# Patient Record
Sex: Male | Born: 1935
Health system: Southern US, Community
[De-identification: ages and names within clinical notes are randomized; demographics above are authoritative.]

## PROBLEM LIST (undated history)

## (undated) DIAGNOSIS — I2510 Atherosclerotic heart disease of native coronary artery without angina pectoris: Secondary | ICD-10-CM

## (undated) DIAGNOSIS — Z7901 Long term (current) use of anticoagulants: Secondary | ICD-10-CM

## (undated) DIAGNOSIS — I10 Essential (primary) hypertension: Secondary | ICD-10-CM

## (undated) DIAGNOSIS — I4891 Unspecified atrial fibrillation: Secondary | ICD-10-CM

## (undated) DIAGNOSIS — Z950 Presence of cardiac pacemaker: Secondary | ICD-10-CM

## (undated) DIAGNOSIS — D510 Vitamin B12 deficiency anemia due to intrinsic factor deficiency: Secondary | ICD-10-CM

## (undated) DIAGNOSIS — N210 Calculus in bladder: Secondary | ICD-10-CM

## (undated) DIAGNOSIS — E785 Hyperlipidemia, unspecified: Secondary | ICD-10-CM

## (undated) DIAGNOSIS — N200 Calculus of kidney: Secondary | ICD-10-CM

## (undated) HISTORY — DX: Long term (current) use of anticoagulants: Z79.01

## (undated) HISTORY — DX: Atherosclerotic heart disease of native coronary artery without angina pectoris: I25.10

## (undated) HISTORY — DX: Essential (primary) hypertension: I10

## (undated) HISTORY — DX: Vitamin B12 deficiency anemia due to intrinsic factor deficiency: D51.0

## (undated) HISTORY — DX: Presence of cardiac pacemaker: Z95.0

## (undated) HISTORY — DX: Calculus of kidney: N20.0

## (undated) HISTORY — DX: Unspecified atrial fibrillation: I48.91

## (undated) HISTORY — PX: APPENDECTOMY: SHX54

## (undated) HISTORY — DX: Hyperlipidemia, unspecified: E78.5

## (undated) HISTORY — DX: Calculus in bladder: N21.0

## (undated) HISTORY — PX: NASAL SINUS SURGERY: SHX719

## (undated) HISTORY — PX: PACEMAKER INSERTION: SHX728

## (undated) HISTORY — PX: INGUINAL HERNIA REPAIR: SHX194

---

## 2002-12-31 ENCOUNTER — Encounter (INDEPENDENT_AMBULATORY_CARE_PROVIDER_SITE_OTHER): Payer: Self-pay | Admitting: Cardiology

## 2002-12-31 ENCOUNTER — Ambulatory Visit (HOSPITAL_COMMUNITY): Admission: RE | Admit: 2002-12-31 | Discharge: 2002-12-31 | Payer: Self-pay | Admitting: Cardiology

## 2003-06-26 ENCOUNTER — Inpatient Hospital Stay (HOSPITAL_COMMUNITY): Admission: EM | Admit: 2003-06-26 | Discharge: 2003-06-29 | Payer: Self-pay | Admitting: Emergency Medicine

## 2004-11-22 ENCOUNTER — Encounter: Admission: RE | Admit: 2004-11-22 | Discharge: 2004-11-22 | Payer: Self-pay | Admitting: Cardiology

## 2006-06-08 ENCOUNTER — Encounter: Admission: RE | Admit: 2006-06-08 | Discharge: 2006-06-08 | Payer: Self-pay | Admitting: Cardiology

## 2006-07-07 IMAGING — CR DG CHEST 2V
2 series · 2 of 2 positions shown · non-contrast
Comparison: 06/28/03.

CLINICAL DATA: Evaluate pacemaker.
 TWO VIEW CHEST:

[w chest pa]
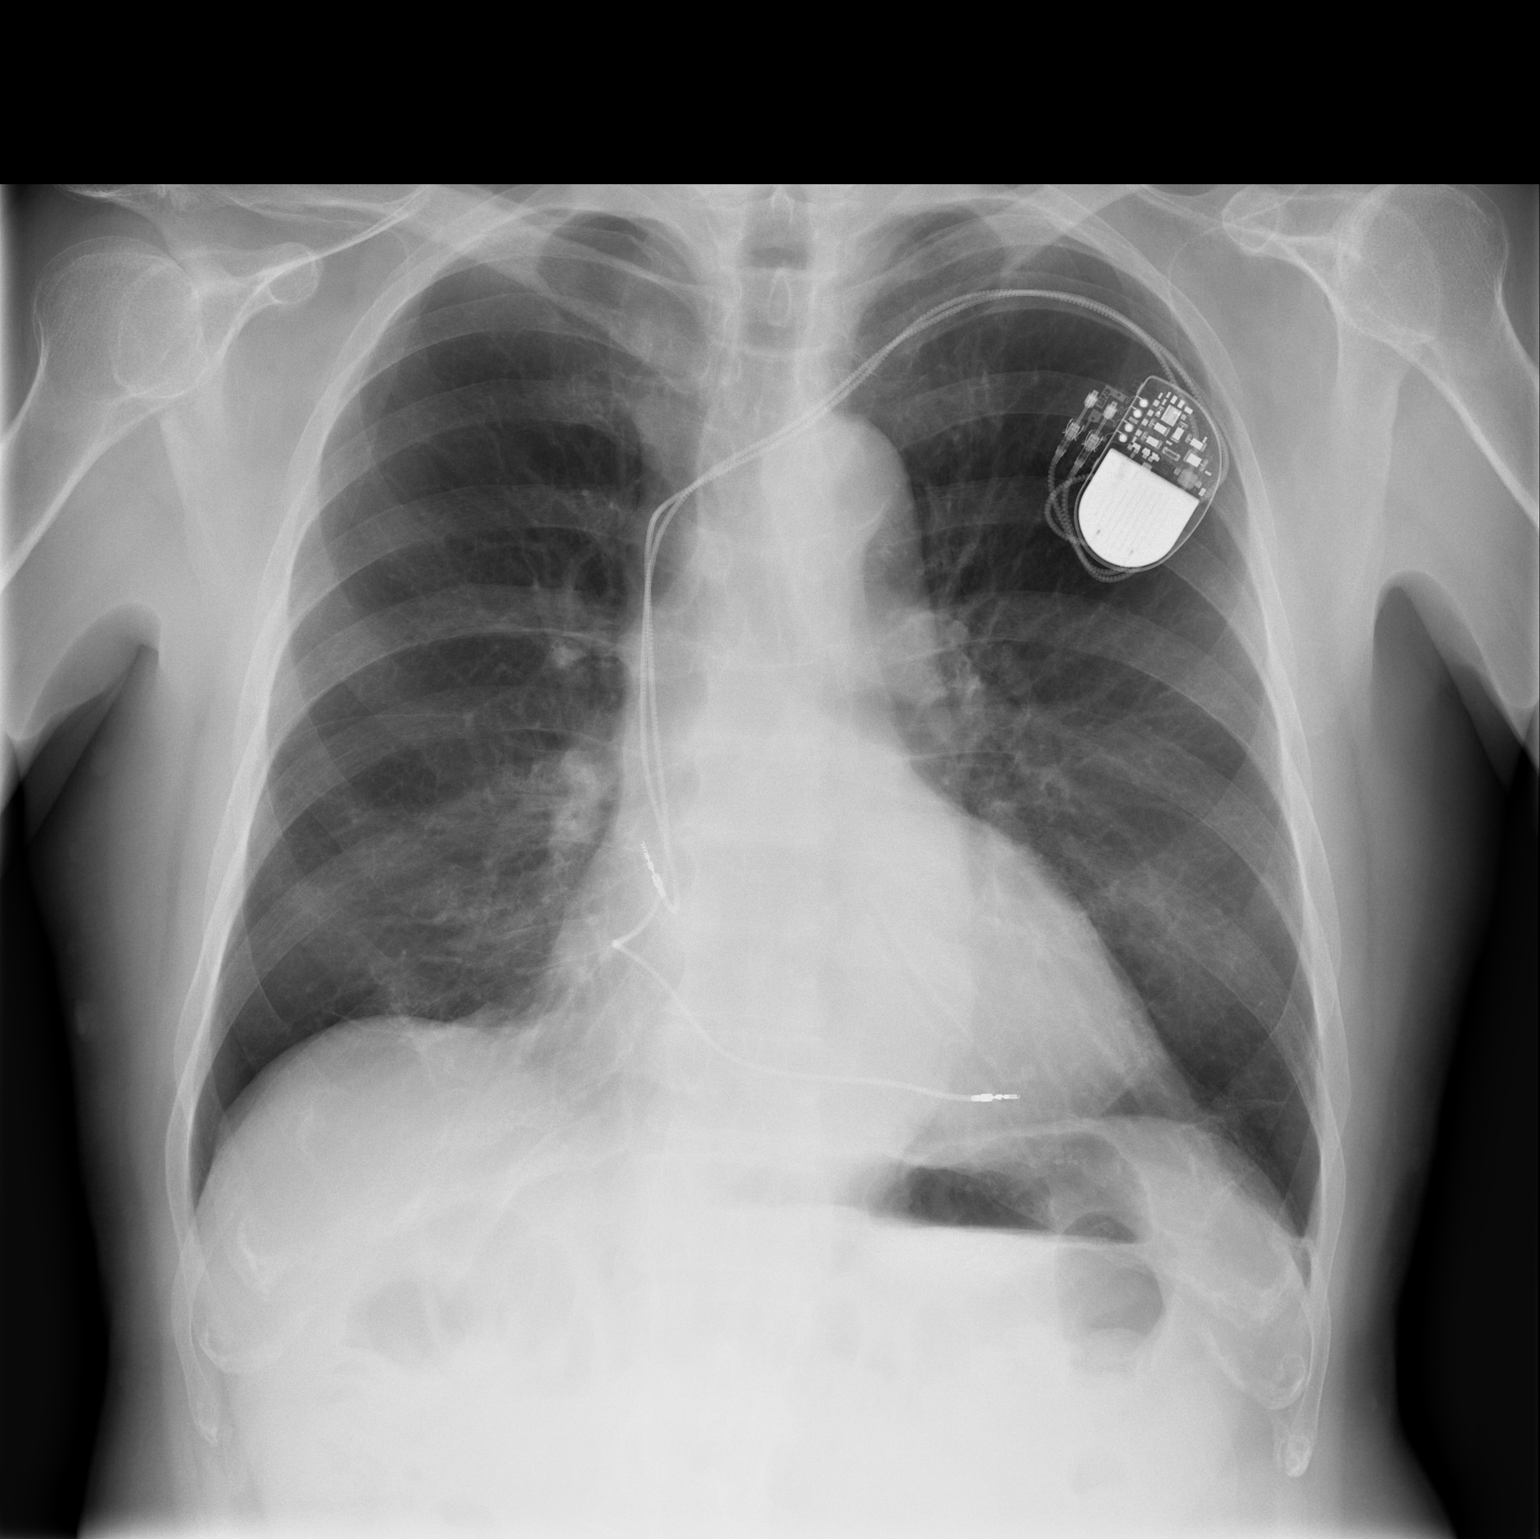

[w chest lat]
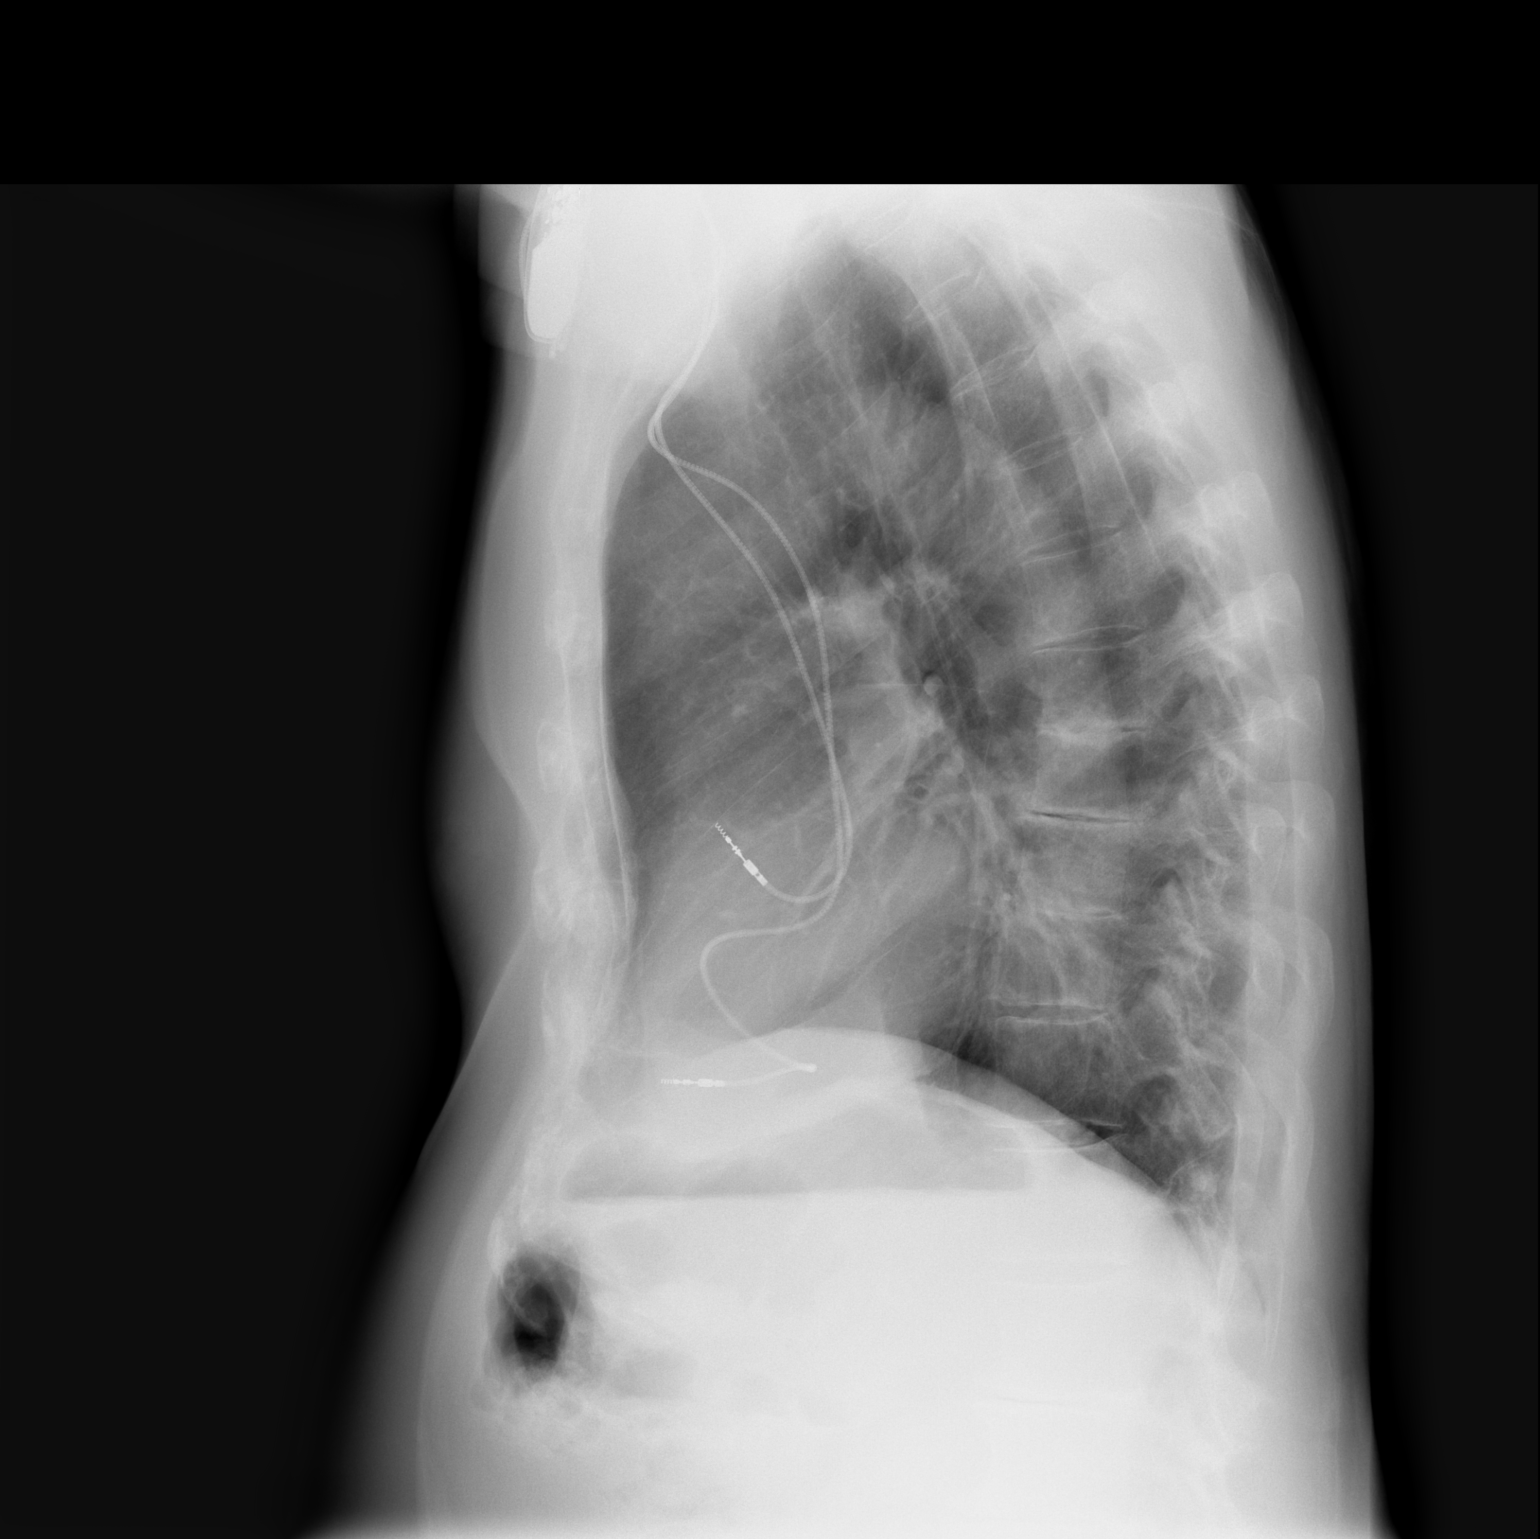

[2 of 2 positions shown; findings below may reference images not displayed]

FINDINGS: A left sided AICD device is identified with leads in the projection of the right atrial appendage and right ventricle.   Leads are intact.  No complications noted. 
 Heart size is upper limits of normal.  No focal lung opacities are identified.  There is no evidence for pneumonia.
IMPRESSION: No active cardiopulmonary abnormalities.
 No complications associated with the AICD device noted.

## 2007-06-12 ENCOUNTER — Encounter: Admission: RE | Admit: 2007-06-12 | Discharge: 2007-06-12 | Payer: Self-pay | Admitting: Cardiology

## 2008-01-21 IMAGING — CR DG CHEST 2V
2 series · 2 of 2 positions shown · non-contrast
Comparison: 11/22/04.

CLINICAL DATA: Pacemaker.  
 CHEST ? 2 VIEW:

[w chest pa]
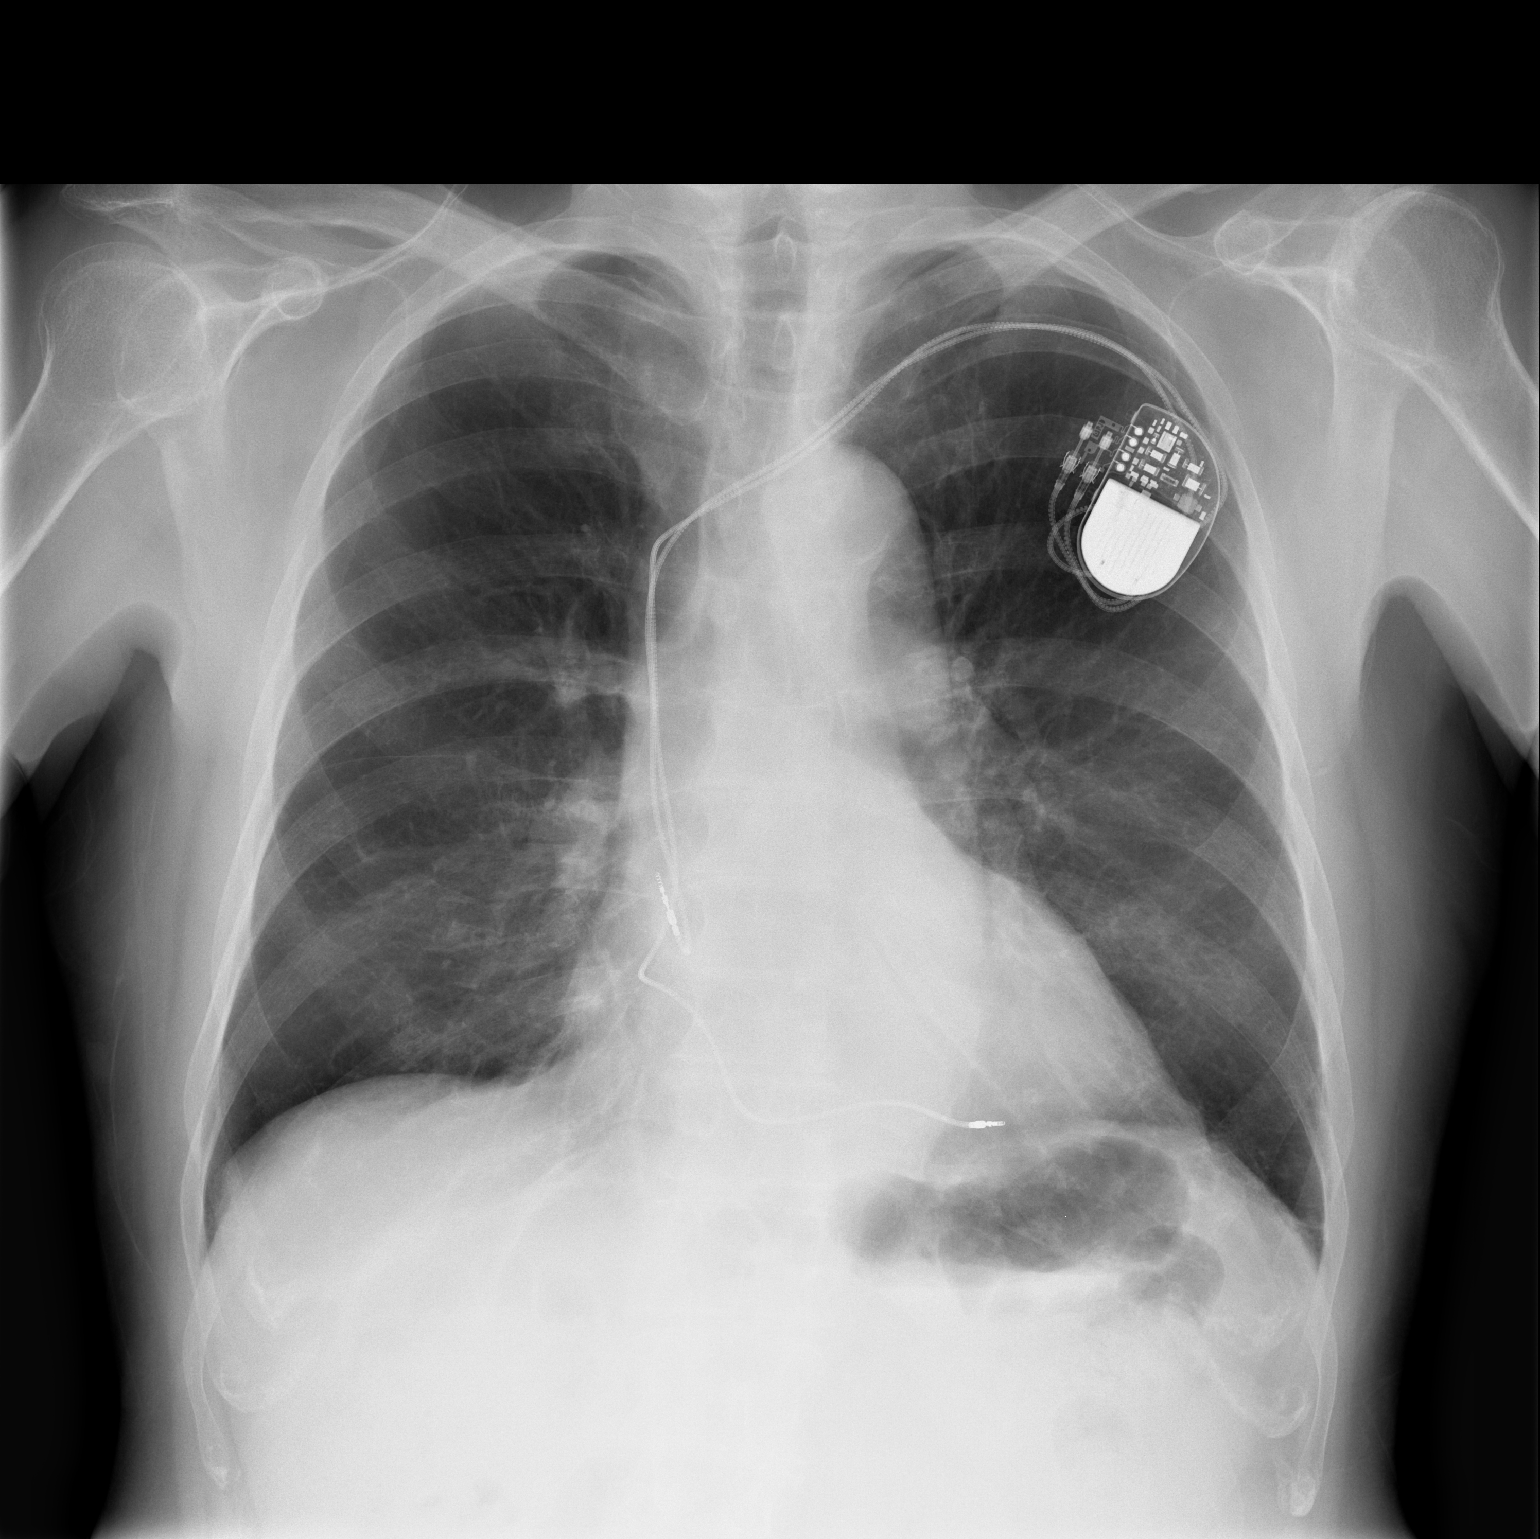

[w chest lat]
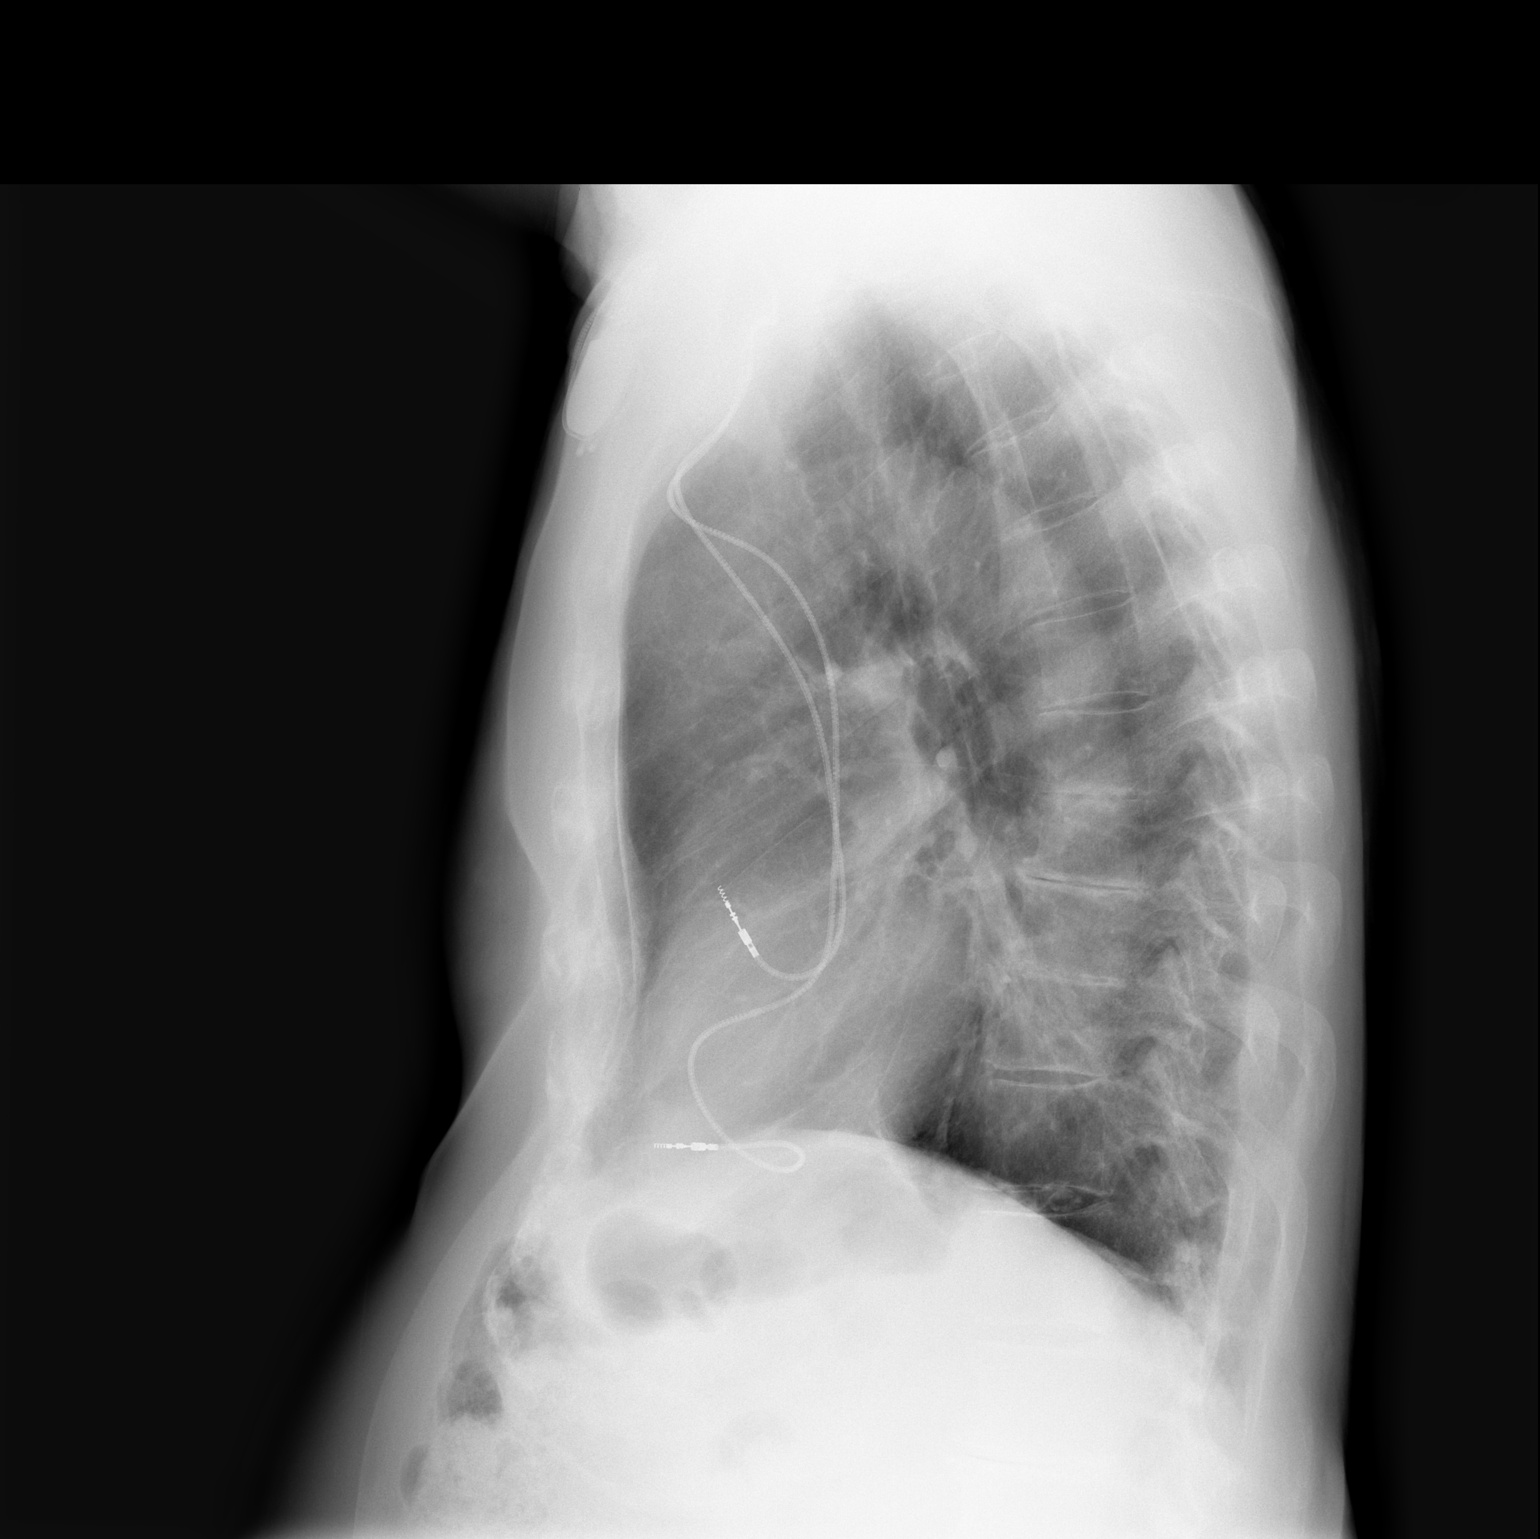

[2 of 2 positions shown; findings below may reference images not displayed]

FINDINGS: Dual lead pacemaker remains in place with the leads unchanged and in the region of the right atrium and right ventricle.  Heart size is normal.  The vascularity is normal.  No effusions.  Ordinary degenerative changes affect the spine.
IMPRESSION: No active disease.

## 2008-06-10 ENCOUNTER — Encounter: Admission: RE | Admit: 2008-06-10 | Discharge: 2008-06-10 | Payer: Self-pay | Admitting: Cardiology

## 2010-01-23 IMAGING — CR DG CHEST 2V
2 series · 2 of 2 positions shown · non-contrast
Comparison: [HOSPITAL] at [REDACTED] [HOSPITAL] chest x-ray
06/12/2007.

CLINICAL DATA: Coronary atherosclerosis.  No current chest
complaints.

CHEST - 2 VIEW

[w chest pa]
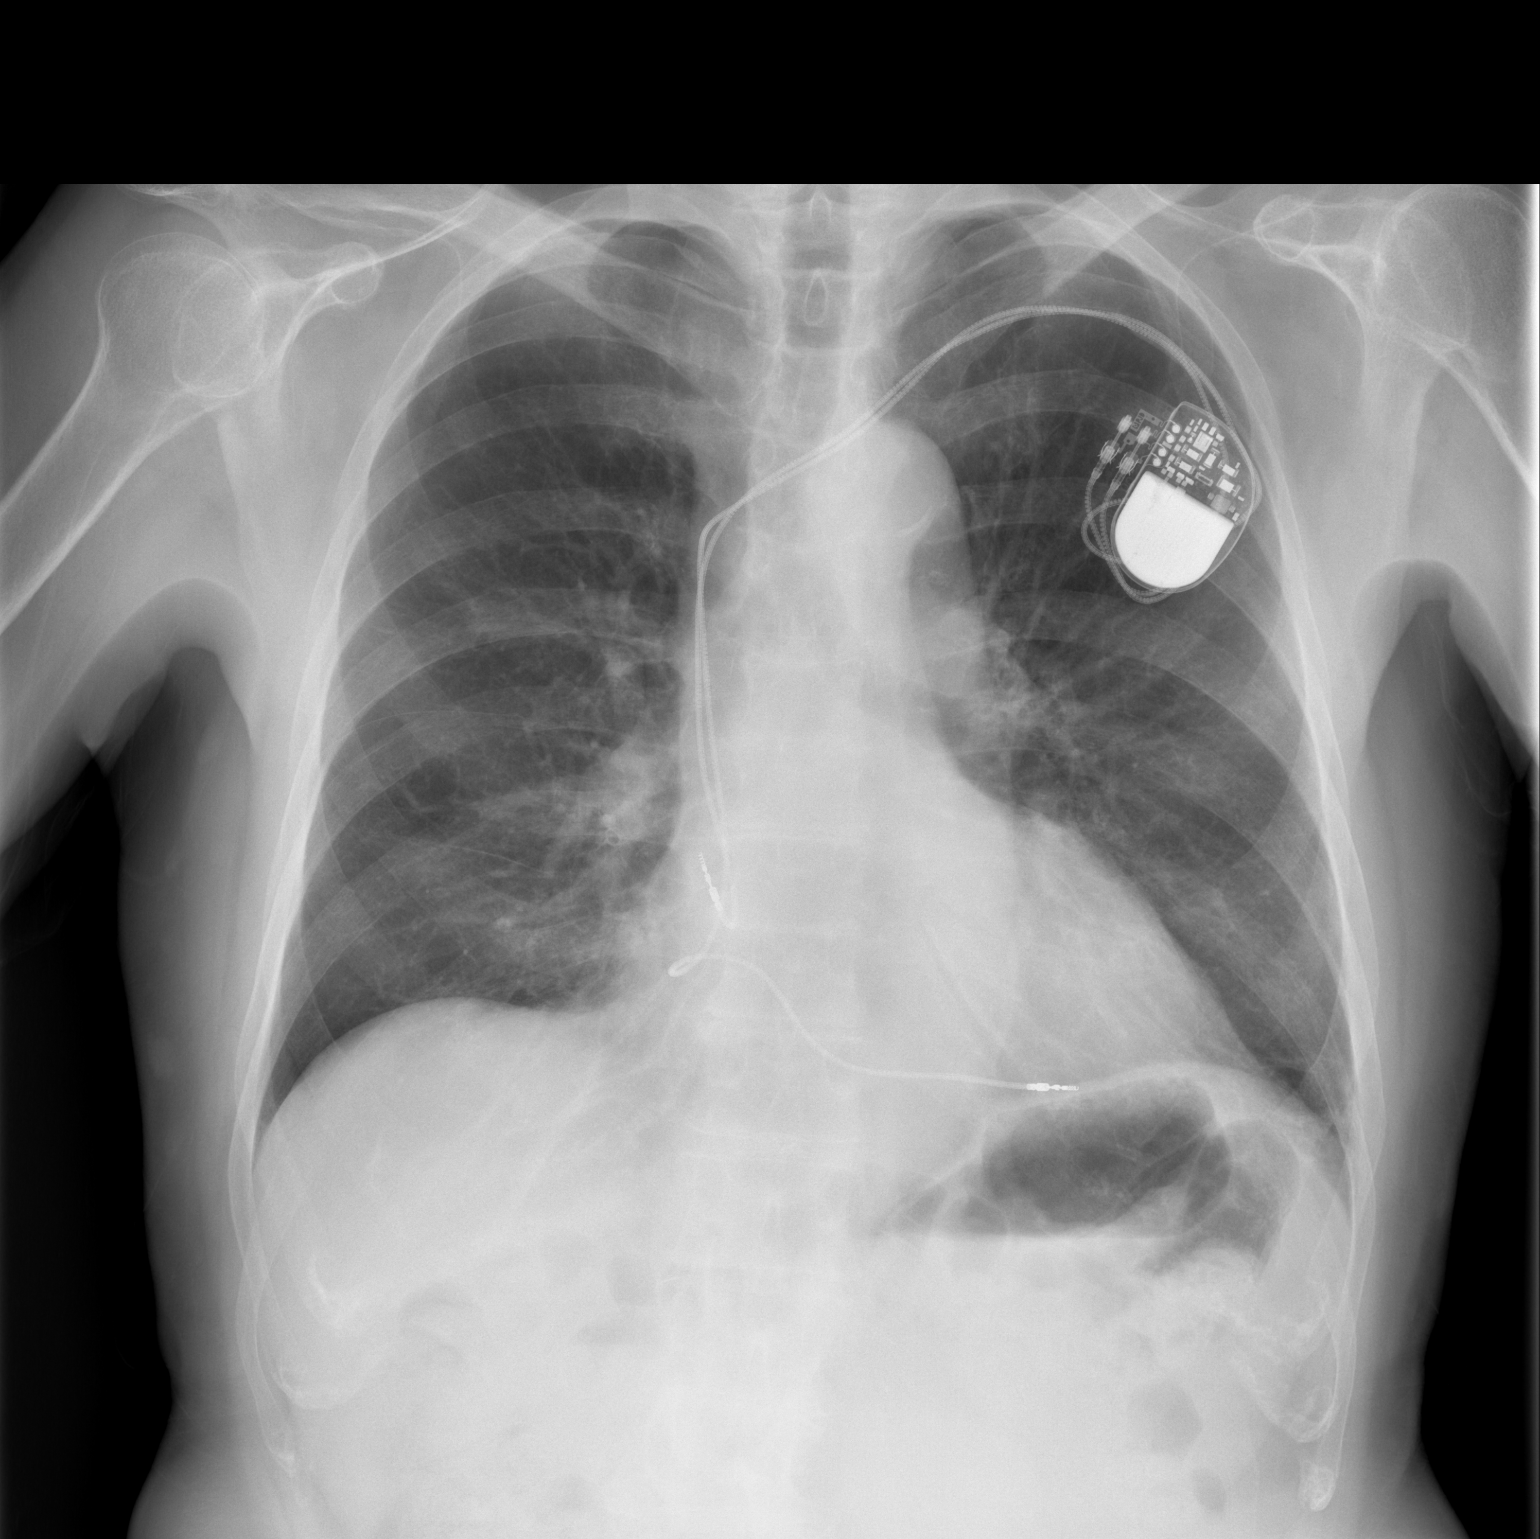

[w chest lat]
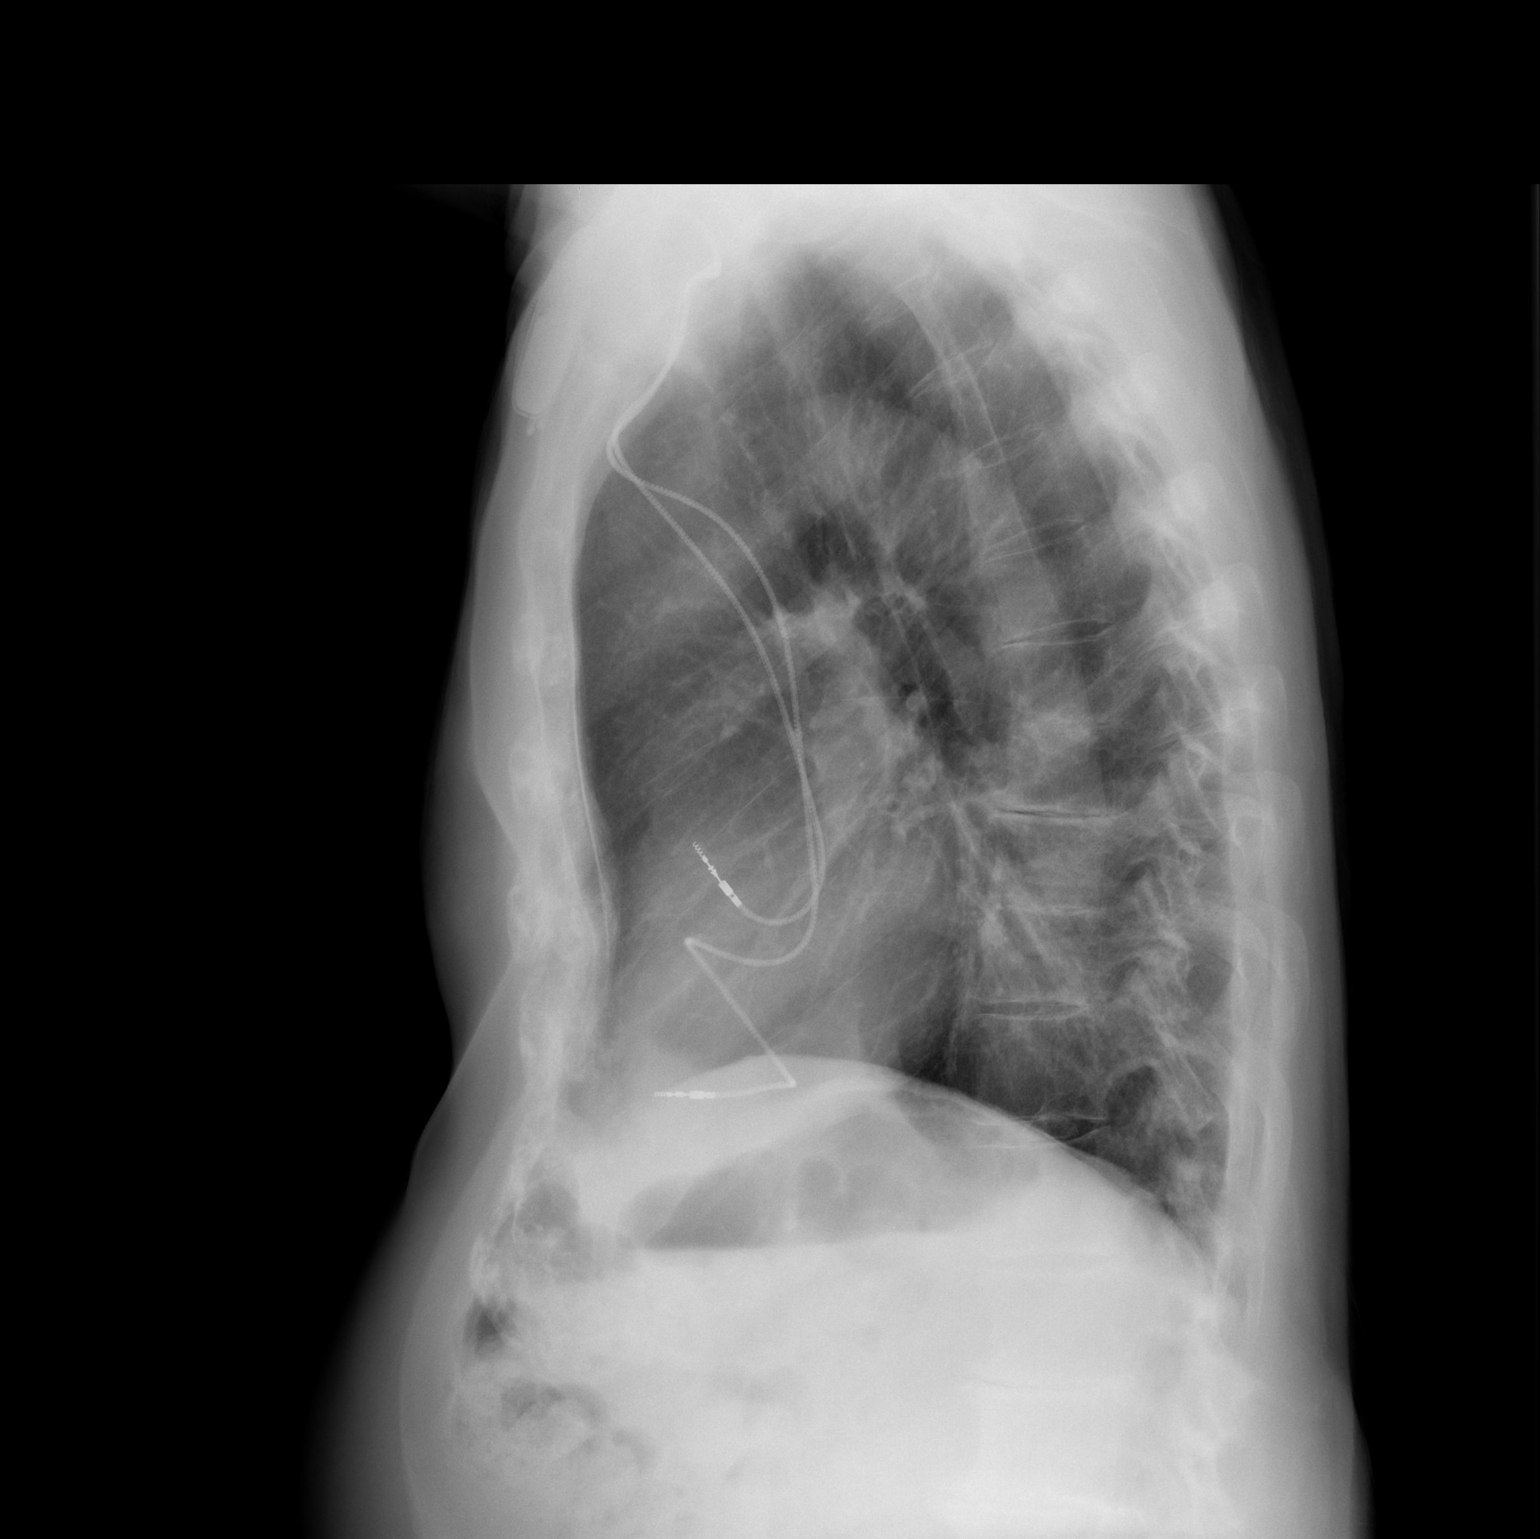

[2 of 2 positions shown; findings below may reference images not displayed]

FINDINGS: Lungs remain clear.  Heart size is upper limits of normal
with stable position of dual lead permanent cardiac pacemaker.  No
new significant abnormalities seen.
IMPRESSION: Stable.  No active disease.

REF:G3 DICTATED: 06/10/2008 [DATE]

## 2010-09-17 NOTE — Op Note (Signed)
NAMEVAL, SCHIAVO NO.:  [id number]   MEDICAL RECORD NO.:  [id number]                   PATIENT TYPE:  INP   LOCATION:  2927                                 FACILITY:  [facility]   PHYSICIAN:  Doylene Canning. Ladona Ridgel, M.D.               DATE OF BIRTH:  1916-06-14   DATE OF PROCEDURE:  06/27/2003  DATE OF DISCHARGE:                                 OPERATIVE REPORT   PROCEDURE PERFORMED:  Implantation of a dual-chamber pacemaker.   INDICATION:  Complete heart block with syncope.   A. INTRODUCTION:  The patient is an 77 year old male, who was admitted to     the hospital with a heart rate of 30, who had experienced a syncopal     episode.  His cardiac enzymes were abnormal, and he underwent     catheterization.  This demonstrated noncritical coronary disease and mild     LV dysfunction.  He is now referred for permanent pacemaker insertion.   II:  PROCEDURE:  After informed consent was obtained, the patient was taken  to the diagnostic EP lab in a fasted state.  After the usual preparation and  draping, a total of 30 mL of lidocaine was infiltrated into the left  infraclavicular region.  The 5 cm incision was carried out over the  infraclavicular region and electrocautery utilized to dissect down to the  fascia plane.  The subclavian vein was punctured x 2 in its extrathoracic  portion, and a Medtronic model 5076, 58 cm active fixation pacing lead,  serial number [id number] was advanced into the right ventricle.  Mapping was  carried out in the right ventricle into the final site in the septum of the  artery apex, the R waves (pace) were 12 mV, and the patient's threshold in  the ventricle was 0.6 volts at 0.5 msec.  The patient's impedance was 700  ohms.  Ten volt pacing did not stimulate the diaphragm and the right  ventricle.  With the ventricular lead in satisfactory position, attention  was then turned to placement of the atrial lead.  It was paced in the  right  atrial appendage where P waves measured 1.5 mV, and the atrial threshold was  somewhat elevated initially at 2 volts at 0.5 msec.  The pacing impedance,  however, was stable at 416 ohms.  Once the lead was actually fixed, the 10  volt pacing again did not stimulate the diaphragm.  At this point, the lead  was secured to the subpectoralis fascia with a figure-of-eight silk suture.  __________ sleeve was secured with silk suture.  Electrocautery was  utilizing to make a subcutaneous pocket.  Kanamycin irrigation was utilized  to irrigate the pocket.  Electrocautery was utilized to assure hemostasis.  The Medtronic Sigma dual-chamber pacemaker, serial number [id number] was  connected to the atrial and ventricular pacing leads were placed in the  subcutaneous pocket.  Additional kanamycin  was utilized to irrigate the  pocket and generator secured with a silk suture.  The incision was closed  with a layer of 2-0 Vicryl, followed by 3-0 Vicryl, followed by a layer of 4-  0 Vicryl.  Benzoin was painted on the skin.  Steri-Strips were applied and a  pressure dressing placed, and the patient returned to his room in  satisfactory condition.  __________  complications; there were no mini procedure complications, no known poor  results, and this demonstrates successful implantation of a Medtronic dual-  chamber pacemaker in a patient with symptomatic complete heart block and a  ventricular escape of 30 beats per minute.                                               Doylene Canning. Ladona Ridgel, M.D.    GWT/MEDQ  D:  06/27/2003  T:  06/28/2003  Job:  [id number]   cc:   Darden Palmer., M.D.  [address], [address]  [place]  [place] [address]  Fax: [phone]

## 2010-09-17 NOTE — Cardiovascular Report (Signed)
NAMEEMBRY, HUSS NO.:  [id number]   MEDICAL RECORD NO.:  [id number]                   PATIENT TYPE:  INP   LOCATION:  2015                                 FACILITY:  [facility]   PHYSICIAN:  W. Ashley Royalty., M.D.         DATE OF BIRTH:  October 08, 1916   DATE OF PROCEDURE:  06/27/2003  DATE OF DISCHARGE:  06/29/2003                              CARDIAC CATHETERIZATION   INDICATIONS:  Abnormal cardiac enzymes in a patient who presented with  complete heart block.   DESCRIPTION OF PROCEDURE:  The patient had previously had a temporary  pacemaker placed via the right femoral vein.  Catheterization was done  without difficulty however, the right femoral artery had inadvertently been  entered while attempting to access the femoral vein and we were unable to do  closure of the vessel.  Following the procedure, the arterial sheath was  removed, the temporary pacemaker was left in place and he was transferred to  the holding area to have a permanent pacemaker inserted later by Dr. Ladona Ridgel.  The patient tolerated the procedure well.   HEMODYNAMIC DATA:  Aortic post-contrast:  136/66, LV post-contrast:  163/9-  10.   ANGIOGRAPHIC DATA:  Left ventriculogram performed in the 30 degrees RAO  projection:  The aortic valve is normal, the mitral valve is normal.  The  left ventricular has a dyssynchronous pattern of contraction. The estimated  ejection fraction appears to be slightly reduced and estimated at around  45%. There may be mild hypokinesis of the anterior wall.  Coronary arteries  arise normally. There was no significant coronary calcification of  significance seen. The left main coronary artery is normal.   Left anterior descending artery:  Proximal eccentric 70% stenosis with mild  irregularities in the mid and distal portions. At the very far distal  portion at the apex is a 90% stenosis with a small, continuous branch  supplying the distal inferior  wall. Two diagonal branches arise which have  mild irregularity.   The circumflex coronary artery has a single marginal artery which has only  scattered irregularities.   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  There is no significant focal  obstructive stenoses noted.   IMPRESSION:  1. No significant gradient across the aortic valve.  2. Mildly depressed left ventricular function.  3. Coronary artery disease which is only mild to moderate proximally in the     left anterior descending artery and mild disease in the right coronary     artery with significant disease in the distal left anterior descending.   RECOMMENDATIONS:  Suture venous in, remove arterial sheath.  Permanent  pacemaker later this afternoon.  Darden Palmer., M.D.    WST/MEDQ  D:  06/27/2003  T:  06/29/2003  Job:  [id number]   cc:   Durenda Hurt, M.D.  [address]  [place], [place] [address]  Fax: [phone]

## 2010-09-17 NOTE — H&P (Signed)
NAMEJOANDY, [name] NO.:  [id number]   MEDICAL RECORD NO.:  [id number]                   PATIENT TYPE:  EMS   LOCATION:  MAJO                                 FACILITY:  [facility]   PHYSICIAN:  W. Ashley Royalty., M.D.         DATE OF BIRTH:  [birth year]   DATE OF ADMISSION:  06/26/2003  DATE OF DISCHARGE:                                HISTORY & PHYSICAL   HISTORY OF PRESENT ILLNESS:  An 77 year old male who is admitted for  evaluation of third degree heart block.  He previously has been in good  health except for a history of hypertension and a cardiac murmur.  He  developed vertigo and dizziness last summer and was evaluated by me at which  time he was thought to have significant vertigo.  He never had syncope.  He  has significant macular degeneration, but denied any dyspnea on exertion,  PND, orthopnea, edema, angina, or claudication at that time.  An  echocardiogram at the time showed mild aortic valve disease with preserved  left ventricular function and no evidence of aortic stenosis.  He was on  hydrochlorothiazide and it was recommended that he be tried on Norvasc.  Since that time, he has been under Dr. Marnee Guarneri care and has been tried on  several blood pressure medicines and is currently on a Clonidine patch.  He  had also noted some dyspnea on exertion recently.  He had not had PND or  orthopnea and denied any chest pain.  The dyspnea was new since last summer.  He was in his usual state of health after eating breakfast this morning and  was cleaning his dentures at the sink when he had a syncopal episode without  warning.  He had no nausea, no seizures, and was not incontinent.  He was  taken to [facility] where he was found to be in third degree heart  block with a stable blood pressure and was transferred here.  He has denied  any significant chest pain.   PAST MEDICAL HISTORY:  Remarkable for hypertension and hyperlipidemia.  History of vertigo.  He also has a history of bifascicular block with a  right bundle branch block and left axis deviation previously.   PAST SURGICAL HISTORY:  Appendectomy and inguinal herniorrhaphy.  Nasal  surgery.   ALLERGIES:  No known drug allergies.   CURRENT MEDICATIONS:  1. Aspirin daily.  2. Hydrochlorothiazide daily.  3. Clonidine patch.   FAMILY HISTORY:  Father died at age 31, mother died at age 39 with  Alzheimer's.  One sister died of colon cancer at age 30, one sister age 67  is alive and well.   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  He quit smoking prior to 1980.  He does not use significant alcohol.  He has been active hunting.  He  lives  at home currently with his wife.   REVIEW OF SYSTEMS:  He has denied any significant weight loss, fatigue, or  change in exercise tolerance.  SKIN:  No rashes, lesions, or problems.  EYES:  He has a significant history of macular degeneration.  He denies  diplopia or glaucoma.  ENT:  Denies hair loss, epistaxis, hoarseness, or  difficulty speaking.  He has some mild dyspnea on exertion, but denies  cough, wheezing, or hemoptysis.  CARDIOVASCULAR:  See HPI.  ABDOMEN:  Denies  dyspepsia, ulcers, GI bleeding, constipation, or diarrhea.  GENITOURINARY:  She has some nocturia and some hesitancy.  MUSCULOSKELETAL:  He has mild  diffuse arthritis.  NEUROLOGY:  He has some recent onset of vertigo, but  none in the past several months.  PSYCHIATRIC:  No history of depression or  change in cognitive functions. He has no history of heat or cold  intolerance, dyspepsia, or polyuria.   PHYSICAL EXAMINATION:  GENERAL:  He is a pleasant male who is currently in  no acute distress.  Appears slightly younger than his stated age.  VITAL SIGNS:  Blood pressure currently 170/70 in the right arm.  Pulse  currently 30 and regular.  SKIN:  Shows multiple sebaceous cysts.   HEENT:  Normocephalic balding male pattern.  PERRLA.  CNS clear.  Funduscopic examination not performed.  Ears, nose, and throat unremarkable.  He is edentulous with full mouth dentures.  CHEST:  Clear to A&P.  HEART:  Normal S1 and S2, no S3. Soft slow rhythm. There is a 1/6 systolic  murmur at the left sternal border.  ABDOMEN:  Soft and nontender.  No masses, hepatosplenomegaly, or aneurysm  noted.  RECTAL:  Deferred due to cardiac condition.  Peripheral pulses are 2+  bilaterally without bruits.  EXTREMITIES:  No deformities, cyanosis, clubbing, or edema, or erythema.  Lymph nodes normal.  NEUROLOGY:  Normal.   EKG shows right bundle branch block, left axis deviation.  Bifascicular  block.   IMPRESSION:  1. Third degree heart block without warning this morning in a patient with     known bifascicular block.  We will need to rule out an ischemic etiology     for this also.  2. Hypertension - currently not controlled with some intolerance to blood     pressure medicines.  3. History of mild aortic regurgitation and mildly thickened aortic valve.   RECOMMENDATIONS:  He is admitted to the CCU.  He will have an external  pacemaker placed.  Serial enzymes will be obtained.  Consultation with Doylene Canning. Ladona Ridgel, M.D. for possible pacemaker.  If enzymes elevated, possible  catheterization.  His laboratory data was evaluated and shows a white count  of 9400, hemoglobin 14.7, hematocrit 44.7 at [facility].  Pro-time and INR was  1, PTT was 31.  B-natruretic peptide was 521.  Magnesium and phosphorus were  normal.  Chemistry panel shows a sodium of 144, potassium 4, chloride 110,  bicarb 22, glucose 116, BUN 25, creatinine 0.9.  Liver enzymes were all  normal.  One enzyme done down there showed a CPK of 295 with an MB of 14.  We will repeat that up here.                                                Darden Palmer.,  M.D.    WST/MEDQ  D:  06/26/2003  T:  06/26/2003  Job:   [id number]   cc:   Durenda Hurt, M.D.  [address]  [place], [place] [address]  Fax: [phone]   Doylene Canning. Ladona Ridgel, M.D.

## 2010-09-17 NOTE — Discharge Summary (Signed)
NAMESYLVESTER, MINTON NO.:  [id number]   MEDICAL RECORD NO.:  [id number]                   PATIENT TYPE:  INP   LOCATION:  2015                                 FACILITY:  [facility]   PHYSICIAN:  Darden Palmer., M.D.         DATE OF BIRTH:  05-19-1916   DATE OF ADMISSION:  06/26/2003  DATE OF DISCHARGE:  06/29/2003                                 DISCHARGE SUMMARY   FINAL DIAGNOSES:  1. Third degree heart block with a known history of bifascicular block.     a. Status post permanent pacemaker implantation.  2. Mild elevation of CPK and troponin.  Cardiac catheterization shows distal     disease involving the LAD and only mild to moderate disease elsewhere.  3. Hypertension.  4. Hyperlipidemia.  5. Aortic valve disease with mild aortic regurgitation.   PROCEDURES:  1. Insertion of temporary pacemaker.  2. Cardiac catheterization with coronary angiograms and left ventriculogram.  3. Insertion of permanent dual-chamber pacemaker by Dr. Ladona Ridgel.   HISTORY:  This 77 year old male was evaluated with dizziness and vertigo  this summer previously, at which time he had vertigo.  He had also  bifascicular block but no syncope.  Echocardiogram showed mild aortic valve  disease with preserved LV function.  No evidence of aortic stenosis.  He had  been placed on a clonidine patch recently, but it dosed increasing dyspnea  on exertion but no PND or orthopnea, and no chest pain.  He was in his usual  state of health after eating breakfast, until the morning of admission, when  he was cleaning his dentures at the sink and had a syncopal episode without  warning.  He had no nausea or seizures and was not incontinent.  He was  taken to [facility] where he was found to be in third degree heart  block with a stable blood pressure and was transferred here.  Please see the  previously dictated history and physical for the remainder of the details.   HOSPITAL  COURSE:  Laboratory data at [facility] showed a sodium of 144,  potassium of 4, chloride of 100, bicarbonate of 22, glucose of 116, BUN of  25, creatinine of 0.9.  CPK was 245 at [facility].  PT and PTT were  normal.  His chest x-ray was unremarkable.  EKG showed third degree heart  block.  He developed an MB to 40 with a troponin of 0.2.  It was felt that  he would likely need a cardiac catheterization prior to permanent pacemaker  with a temporary pacemaker implanted.  He had no chest pain and his blood  pressure remained stable.  While being transferred to the catheterization  laboratory, he had asystole and required placement of a temporary pacemaker  with good capture.  Cardiac catheterization was done, showing left ventricle  with a dyssynchronous pattern with mild decrease in LV function with an  ejection fraction of  45%.  Left main coronary was normal.  LAD had a  proximal 90% stenosis of the apex.  Circumflex had irregularities, right  coronary had aneurysmal dilation with irregularities.  Later on that  evening, he underwent implantation of the permanent pacemaker by Dr. Ladona Ridgel.  He had a Sigma [id number], serial [id number] generator.  The atrial lead was a  Medtronic [id number], serial [id number].  Ventricular lead was a Medtronic [id number],  serial [id number].  Atrial impedance was 416 ohms.  Ventricular 77 ohms.  Atrial threshold 2.0, volts at 0.5 milliseconds.  Ventricular threshold 0.6  volts at 0.5 milliseconds.  T wave was 1.5 millivolts, R wave was 12  millivolts.  He had no pacing of the diaphragm.  He had some mild precordial  chest pain overnight, relieved with Tylenol, and his hypertension was  resolved.  He was transferred to telemetry and was able to be discharged on  06/29/03 without symptoms.  His pacemaker was checked and was working well.  He was discharged in improved condition on aspirin 81 mg daily.  He is to  call the office for an appointment in one  week.  His  EKG was checked and was pacing well.  CPK rose to a total of  2,144 with 41.8 units of MB noted.  PT and PTT were unremarkable.  Troponin  was 0.13.  He was discharged home in improved condition, and he may have to  have his blood pressure medicines adjusted as an outpatient.                                                Darden Palmer., M.D.    WST/MEDQ  D:  07/16/2003  T:  07/18/2003  Job:  [id number]   cc:   Doylene Canning. Ladona Ridgel, M.D.   Durenda Hurt, M.D.  [address]  [place], [place] [address]  Fax: [phone]

## 2010-09-17 NOTE — Op Note (Signed)
NAMEJANATHAN, BRIBIESCA NO.:  [id number]   MEDICAL RECORD NO.:  [id number]                   PATIENT TYPE:  INP   LOCATION:  2015                                 FACILITY:  [facility]   PHYSICIAN:  W. Ashley Royalty., M.D.         DATE OF BIRTH:  01-29-1917   DATE OF PROCEDURE:  06/29/2003  DATE OF DISCHARGE:  06/29/2003                                 OPERATIVE REPORT   INDICATIONS:  An 77 year old male with third degree heart block who  developed asystole.   DESCRIPTION OF PROCEDURE:  The patient was brought to the catheterization  lab and had asystole while moving him to the table. The right groin was  prepped and draped and after Xylocaine anesthesia, a 6-French sheath was  placed in the right femoral vein percutaneously.  A 5 French temporary  pacemaker was advanced to the right ventricular apex where a good capture  was confirmed with a threshold of less than 0.5 MA.   IMPRESSION:  Successful implantation of a temporary transvenous pacemaker  through the right femoral vein.   RECOMMENDATIONS:  Proceed with coronary angiograms and consider permanent  pacemaker later on today.                                               Darden Palmer., M.D.    WST/MEDQ  D:  06/27/2003  T:  06/29/2003  Job:  [id number]   cc:   Durenda Hurt, M.D.  [address]  [place], [place] [address]  Fax: [phone]   Carolanne Grumbling, M.D.  Fax: [phone]

## 2010-09-17 NOTE — Consult Note (Signed)
[name], [name] NO.:  [id number]   MEDICAL RECORD NO.:  [id number]                   PATIENT TYPE:  INP   LOCATION:  2927                                 FACILITY:  [facility]   PHYSICIAN:  Doylene Canning. Ladona Ridgel, M.D.               DATE OF BIRTH:  1917/01/03   DATE OF CONSULTATION:  06/26/2003  DATE OF DISCHARGE:                                   CONSULTATION   Consultation was requested by Dr. Viann Fish.  Indication for  consultation is evaluation of complete heart block.   CHIEF COMPLAINT:  I passed out.   HISTORY OF PRESENT ILLNESS:  The patient is a very pleasant 77 year old man  with a history of hypertension and known AV conduction system disease with  bifascicular block in the past.  He had an echo several months ago, which  demonstrated very mild aortic valve stenosis.  He has had chronic dyspnea.  The patient was in his usual state of health until earlier today when he  suddenly passed out.  His wife, upon finding him, stated that he was  initially not responsive, but then began speaking.  He denies chest pain or  shortness of breath.  He did not seriously injure himself with his syncopal  spell.  There was no warning.  He had been on clonidine in the past for  hypertension.  He was taken to the [facility] where he  was found to be in complete heart block and now referred for additional  evaluation.   PAST MEDICAL HISTORY:  1. History of hypertension.  2. History of vertigo.  3. History of hyperlipidemia.   MEDICATIONS ON ADMISSION:  Include hydrochlorothiazide and clonidine.   FAMILY HISTORY:  Noncontributory.   SOCIAL HISTORY:  The patient is married.  He denies tobacco or ethanol  abuse.   REVIEW OF SYMPTOMS:  As noted in the HPI.  He has very minimal arthritic  complaints and is otherwise active, except for chronic dyspnea.   PHYSICAL EXAMINATION:  GENERAL:  He is a pleasant, well-appearing, elderly  man in  no distress.  VITAL SIGNS:  Blood pressure was 170/70; his pulse was 30 and regular; the  respirations were 18.  HEENT:  Normocephalic, atraumatic.  The pupils were equal and round.  The  oropharynx was moist and sclerae were anicteric.  NECK:  No jugulovenous distension.  There were cannon A-waves present.  The  carotids revealed no bruits.  CARDIOVASCULAR:  Regular bradycardia with a soft systolic murmur at the  right upper sternal border.  LUNGS:  Clear bilaterally to auscultation.  There were no wheezes, rales or  rhonchi.  ABDOMEN:  Soft, nontender and nondistended.  There was no organomegaly.  EXTREMITIES:  Demonstrate no cyanosis, clubbing or edema.  Pulses are 1+ and  symmetric.   EKG demonstrates normal sinus rhythm with complete heart block and a right  bundle branch block,  QRS escape at 30 beats per minute.   IMPRESSION:  1. Complete heart block.  2. History of chronic dyspnea.  3. Mild airway stenosis.  4. Positive cardiac enzymes in the setting of complete heart block.  5. Syncope, secondary to above.   DISCUSSION:  The patient will need permanent pacing.  I discussed the risks,  benefits, goals and expectations of permanent pacemaker insertion with the  patient and his wife and they wish to proceed.  I will defer the issue of  catheterization, secondary to the borderline cardiac enzymes, to Dr. Donnie Aho.                                               Doylene Canning. Ladona Ridgel, M.D.    GWT/MEDQ  D:  06/26/2003  T:  06/27/2003  Job:  [id number]

## 2011-05-05 DIAGNOSIS — R05 Cough: Secondary | ICD-10-CM | POA: Diagnosis not present

## 2011-05-06 DIAGNOSIS — I10 Essential (primary) hypertension: Secondary | ICD-10-CM | POA: Diagnosis not present

## 2011-05-06 DIAGNOSIS — E7800 Pure hypercholesterolemia, unspecified: Secondary | ICD-10-CM | POA: Diagnosis not present

## 2011-05-11 DIAGNOSIS — I2510 Atherosclerotic heart disease of native coronary artery without angina pectoris: Secondary | ICD-10-CM | POA: Diagnosis not present

## 2011-05-11 DIAGNOSIS — N200 Calculus of kidney: Secondary | ICD-10-CM | POA: Diagnosis not present

## 2011-05-11 DIAGNOSIS — I10 Essential (primary) hypertension: Secondary | ICD-10-CM | POA: Diagnosis not present

## 2011-05-11 DIAGNOSIS — Z7901 Long term (current) use of anticoagulants: Secondary | ICD-10-CM | POA: Diagnosis not present

## 2011-05-11 DIAGNOSIS — I4891 Unspecified atrial fibrillation: Secondary | ICD-10-CM | POA: Diagnosis not present

## 2011-05-11 DIAGNOSIS — Z950 Presence of cardiac pacemaker: Secondary | ICD-10-CM | POA: Diagnosis not present

## 2011-05-11 DIAGNOSIS — I359 Nonrheumatic aortic valve disorder, unspecified: Secondary | ICD-10-CM | POA: Diagnosis not present

## 2011-05-11 DIAGNOSIS — E785 Hyperlipidemia, unspecified: Secondary | ICD-10-CM | POA: Diagnosis not present

## 2011-05-12 DIAGNOSIS — N200 Calculus of kidney: Secondary | ICD-10-CM | POA: Diagnosis not present

## 2011-05-30 DIAGNOSIS — D510 Vitamin B12 deficiency anemia due to intrinsic factor deficiency: Secondary | ICD-10-CM | POA: Diagnosis not present

## 2011-05-30 DIAGNOSIS — Z7901 Long term (current) use of anticoagulants: Secondary | ICD-10-CM | POA: Diagnosis not present

## 2011-06-08 DIAGNOSIS — I10 Essential (primary) hypertension: Secondary | ICD-10-CM | POA: Diagnosis not present

## 2011-06-08 DIAGNOSIS — E785 Hyperlipidemia, unspecified: Secondary | ICD-10-CM | POA: Diagnosis not present

## 2011-06-08 DIAGNOSIS — I359 Nonrheumatic aortic valve disorder, unspecified: Secondary | ICD-10-CM | POA: Diagnosis not present

## 2011-06-08 DIAGNOSIS — Z950 Presence of cardiac pacemaker: Secondary | ICD-10-CM | POA: Diagnosis not present

## 2011-06-08 DIAGNOSIS — Z7901 Long term (current) use of anticoagulants: Secondary | ICD-10-CM | POA: Diagnosis not present

## 2011-06-08 DIAGNOSIS — I2510 Atherosclerotic heart disease of native coronary artery without angina pectoris: Secondary | ICD-10-CM | POA: Diagnosis not present

## 2011-06-08 DIAGNOSIS — I4891 Unspecified atrial fibrillation: Secondary | ICD-10-CM | POA: Diagnosis not present

## 2011-06-13 DIAGNOSIS — N200 Calculus of kidney: Secondary | ICD-10-CM | POA: Diagnosis not present

## 2011-06-14 DIAGNOSIS — I2510 Atherosclerotic heart disease of native coronary artery without angina pectoris: Secondary | ICD-10-CM | POA: Diagnosis not present

## 2011-06-14 DIAGNOSIS — I359 Nonrheumatic aortic valve disorder, unspecified: Secondary | ICD-10-CM | POA: Diagnosis not present

## 2011-06-14 DIAGNOSIS — I10 Essential (primary) hypertension: Secondary | ICD-10-CM | POA: Diagnosis not present

## 2011-06-14 DIAGNOSIS — I4891 Unspecified atrial fibrillation: Secondary | ICD-10-CM | POA: Diagnosis not present

## 2011-06-14 DIAGNOSIS — Z7901 Long term (current) use of anticoagulants: Secondary | ICD-10-CM | POA: Diagnosis not present

## 2011-06-14 DIAGNOSIS — E785 Hyperlipidemia, unspecified: Secondary | ICD-10-CM | POA: Diagnosis not present

## 2011-06-14 DIAGNOSIS — Z950 Presence of cardiac pacemaker: Secondary | ICD-10-CM | POA: Diagnosis not present

## 2011-06-27 DIAGNOSIS — D510 Vitamin B12 deficiency anemia due to intrinsic factor deficiency: Secondary | ICD-10-CM | POA: Diagnosis not present

## 2011-06-27 DIAGNOSIS — Z7901 Long term (current) use of anticoagulants: Secondary | ICD-10-CM | POA: Diagnosis not present

## 2011-07-06 DIAGNOSIS — Z950 Presence of cardiac pacemaker: Secondary | ICD-10-CM | POA: Diagnosis not present

## 2011-07-25 DIAGNOSIS — Z7901 Long term (current) use of anticoagulants: Secondary | ICD-10-CM | POA: Diagnosis not present

## 2011-07-25 DIAGNOSIS — D510 Vitamin B12 deficiency anemia due to intrinsic factor deficiency: Secondary | ICD-10-CM | POA: Diagnosis not present

## 2011-08-03 DIAGNOSIS — Z950 Presence of cardiac pacemaker: Secondary | ICD-10-CM | POA: Diagnosis not present

## 2011-08-11 DIAGNOSIS — L258 Unspecified contact dermatitis due to other agents: Secondary | ICD-10-CM | POA: Diagnosis not present

## 2011-08-11 DIAGNOSIS — I10 Essential (primary) hypertension: Secondary | ICD-10-CM | POA: Diagnosis not present

## 2011-08-22 DIAGNOSIS — Z7901 Long term (current) use of anticoagulants: Secondary | ICD-10-CM | POA: Diagnosis not present

## 2011-08-22 DIAGNOSIS — D510 Vitamin B12 deficiency anemia due to intrinsic factor deficiency: Secondary | ICD-10-CM | POA: Diagnosis not present

## 2011-08-31 DIAGNOSIS — Z950 Presence of cardiac pacemaker: Secondary | ICD-10-CM | POA: Diagnosis not present

## 2011-09-12 DIAGNOSIS — Z125 Encounter for screening for malignant neoplasm of prostate: Secondary | ICD-10-CM | POA: Diagnosis not present

## 2011-09-12 DIAGNOSIS — N401 Enlarged prostate with lower urinary tract symptoms: Secondary | ICD-10-CM | POA: Diagnosis not present

## 2011-09-12 DIAGNOSIS — N200 Calculus of kidney: Secondary | ICD-10-CM | POA: Diagnosis not present

## 2011-09-14 DIAGNOSIS — N200 Calculus of kidney: Secondary | ICD-10-CM | POA: Diagnosis not present

## 2011-09-19 DIAGNOSIS — Z7901 Long term (current) use of anticoagulants: Secondary | ICD-10-CM | POA: Diagnosis not present

## 2011-09-19 DIAGNOSIS — D510 Vitamin B12 deficiency anemia due to intrinsic factor deficiency: Secondary | ICD-10-CM | POA: Diagnosis not present

## 2011-09-21 DIAGNOSIS — L821 Other seborrheic keratosis: Secondary | ICD-10-CM | POA: Diagnosis not present

## 2011-09-21 DIAGNOSIS — C44519 Basal cell carcinoma of skin of other part of trunk: Secondary | ICD-10-CM | POA: Diagnosis not present

## 2011-09-21 DIAGNOSIS — L570 Actinic keratosis: Secondary | ICD-10-CM | POA: Diagnosis not present

## 2011-09-28 DIAGNOSIS — E785 Hyperlipidemia, unspecified: Secondary | ICD-10-CM | POA: Diagnosis not present

## 2011-09-28 DIAGNOSIS — Z7901 Long term (current) use of anticoagulants: Secondary | ICD-10-CM | POA: Diagnosis not present

## 2011-09-28 DIAGNOSIS — Z950 Presence of cardiac pacemaker: Secondary | ICD-10-CM | POA: Diagnosis not present

## 2011-09-28 DIAGNOSIS — I4891 Unspecified atrial fibrillation: Secondary | ICD-10-CM | POA: Diagnosis not present

## 2011-09-28 DIAGNOSIS — I359 Nonrheumatic aortic valve disorder, unspecified: Secondary | ICD-10-CM | POA: Diagnosis not present

## 2011-09-28 DIAGNOSIS — I2510 Atherosclerotic heart disease of native coronary artery without angina pectoris: Secondary | ICD-10-CM | POA: Diagnosis not present

## 2011-09-28 DIAGNOSIS — I10 Essential (primary) hypertension: Secondary | ICD-10-CM | POA: Diagnosis not present

## 2011-09-29 DIAGNOSIS — C44519 Basal cell carcinoma of skin of other part of trunk: Secondary | ICD-10-CM | POA: Diagnosis not present

## 2011-10-24 DIAGNOSIS — Z7901 Long term (current) use of anticoagulants: Secondary | ICD-10-CM | POA: Diagnosis not present

## 2011-10-24 DIAGNOSIS — D510 Vitamin B12 deficiency anemia due to intrinsic factor deficiency: Secondary | ICD-10-CM | POA: Diagnosis not present

## 2011-10-26 DIAGNOSIS — I4891 Unspecified atrial fibrillation: Secondary | ICD-10-CM | POA: Diagnosis not present

## 2011-10-26 DIAGNOSIS — E785 Hyperlipidemia, unspecified: Secondary | ICD-10-CM | POA: Diagnosis not present

## 2011-10-26 DIAGNOSIS — Z950 Presence of cardiac pacemaker: Secondary | ICD-10-CM | POA: Diagnosis not present

## 2011-10-26 DIAGNOSIS — Z7901 Long term (current) use of anticoagulants: Secondary | ICD-10-CM | POA: Diagnosis not present

## 2011-10-26 DIAGNOSIS — I10 Essential (primary) hypertension: Secondary | ICD-10-CM | POA: Diagnosis not present

## 2011-10-26 DIAGNOSIS — I359 Nonrheumatic aortic valve disorder, unspecified: Secondary | ICD-10-CM | POA: Diagnosis not present

## 2011-10-26 DIAGNOSIS — I2510 Atherosclerotic heart disease of native coronary artery without angina pectoris: Secondary | ICD-10-CM | POA: Diagnosis not present

## 2011-11-22 DIAGNOSIS — I4891 Unspecified atrial fibrillation: Secondary | ICD-10-CM | POA: Diagnosis not present

## 2011-11-22 DIAGNOSIS — Z7901 Long term (current) use of anticoagulants: Secondary | ICD-10-CM | POA: Diagnosis not present

## 2011-11-22 DIAGNOSIS — Z950 Presence of cardiac pacemaker: Secondary | ICD-10-CM | POA: Diagnosis not present

## 2011-11-22 DIAGNOSIS — I2510 Atherosclerotic heart disease of native coronary artery without angina pectoris: Secondary | ICD-10-CM | POA: Diagnosis not present

## 2011-11-22 DIAGNOSIS — E785 Hyperlipidemia, unspecified: Secondary | ICD-10-CM | POA: Diagnosis not present

## 2011-11-22 DIAGNOSIS — I10 Essential (primary) hypertension: Secondary | ICD-10-CM | POA: Diagnosis not present

## 2011-11-22 DIAGNOSIS — I359 Nonrheumatic aortic valve disorder, unspecified: Secondary | ICD-10-CM | POA: Diagnosis not present

## 2011-11-23 DIAGNOSIS — Z950 Presence of cardiac pacemaker: Secondary | ICD-10-CM | POA: Diagnosis not present

## 2011-11-28 DIAGNOSIS — Z7901 Long term (current) use of anticoagulants: Secondary | ICD-10-CM | POA: Diagnosis not present

## 2011-12-13 DIAGNOSIS — Z7901 Long term (current) use of anticoagulants: Secondary | ICD-10-CM | POA: Diagnosis not present

## 2011-12-13 DIAGNOSIS — I10 Essential (primary) hypertension: Secondary | ICD-10-CM | POA: Diagnosis not present

## 2011-12-13 DIAGNOSIS — I359 Nonrheumatic aortic valve disorder, unspecified: Secondary | ICD-10-CM | POA: Diagnosis not present

## 2011-12-13 DIAGNOSIS — I2510 Atherosclerotic heart disease of native coronary artery without angina pectoris: Secondary | ICD-10-CM | POA: Diagnosis not present

## 2011-12-13 DIAGNOSIS — Z950 Presence of cardiac pacemaker: Secondary | ICD-10-CM | POA: Diagnosis not present

## 2011-12-13 DIAGNOSIS — E785 Hyperlipidemia, unspecified: Secondary | ICD-10-CM | POA: Diagnosis not present

## 2011-12-13 DIAGNOSIS — I4891 Unspecified atrial fibrillation: Secondary | ICD-10-CM | POA: Diagnosis not present

## 2011-12-26 DIAGNOSIS — D510 Vitamin B12 deficiency anemia due to intrinsic factor deficiency: Secondary | ICD-10-CM | POA: Diagnosis not present

## 2011-12-26 DIAGNOSIS — Z7901 Long term (current) use of anticoagulants: Secondary | ICD-10-CM | POA: Diagnosis not present

## 2011-12-28 DIAGNOSIS — I4891 Unspecified atrial fibrillation: Secondary | ICD-10-CM | POA: Diagnosis not present

## 2011-12-28 DIAGNOSIS — M899 Disorder of bone, unspecified: Secondary | ICD-10-CM | POA: Diagnosis not present

## 2011-12-28 DIAGNOSIS — M949 Disorder of cartilage, unspecified: Secondary | ICD-10-CM | POA: Diagnosis not present

## 2011-12-28 DIAGNOSIS — I10 Essential (primary) hypertension: Secondary | ICD-10-CM | POA: Diagnosis not present

## 2012-01-09 DIAGNOSIS — N200 Calculus of kidney: Secondary | ICD-10-CM | POA: Diagnosis not present

## 2012-01-09 DIAGNOSIS — N401 Enlarged prostate with lower urinary tract symptoms: Secondary | ICD-10-CM | POA: Diagnosis not present

## 2012-01-23 DIAGNOSIS — Z7901 Long term (current) use of anticoagulants: Secondary | ICD-10-CM | POA: Diagnosis not present

## 2012-01-25 DIAGNOSIS — I2510 Atherosclerotic heart disease of native coronary artery without angina pectoris: Secondary | ICD-10-CM | POA: Diagnosis not present

## 2012-01-25 DIAGNOSIS — I4891 Unspecified atrial fibrillation: Secondary | ICD-10-CM | POA: Diagnosis not present

## 2012-01-25 DIAGNOSIS — I10 Essential (primary) hypertension: Secondary | ICD-10-CM | POA: Diagnosis not present

## 2012-01-25 DIAGNOSIS — Z7901 Long term (current) use of anticoagulants: Secondary | ICD-10-CM | POA: Diagnosis not present

## 2012-01-25 DIAGNOSIS — I359 Nonrheumatic aortic valve disorder, unspecified: Secondary | ICD-10-CM | POA: Diagnosis not present

## 2012-01-25 DIAGNOSIS — Z950 Presence of cardiac pacemaker: Secondary | ICD-10-CM | POA: Diagnosis not present

## 2012-01-25 DIAGNOSIS — E785 Hyperlipidemia, unspecified: Secondary | ICD-10-CM | POA: Diagnosis not present

## 2012-01-30 DIAGNOSIS — Z7901 Long term (current) use of anticoagulants: Secondary | ICD-10-CM | POA: Diagnosis not present

## 2012-02-17 DIAGNOSIS — I10 Essential (primary) hypertension: Secondary | ICD-10-CM | POA: Diagnosis not present

## 2012-02-17 DIAGNOSIS — E785 Hyperlipidemia, unspecified: Secondary | ICD-10-CM | POA: Diagnosis not present

## 2012-02-17 DIAGNOSIS — I4891 Unspecified atrial fibrillation: Secondary | ICD-10-CM | POA: Diagnosis not present

## 2012-02-17 DIAGNOSIS — Z950 Presence of cardiac pacemaker: Secondary | ICD-10-CM | POA: Diagnosis not present

## 2012-02-17 DIAGNOSIS — I2510 Atherosclerotic heart disease of native coronary artery without angina pectoris: Secondary | ICD-10-CM | POA: Diagnosis not present

## 2012-02-17 DIAGNOSIS — I359 Nonrheumatic aortic valve disorder, unspecified: Secondary | ICD-10-CM | POA: Diagnosis not present

## 2012-02-17 DIAGNOSIS — Z7901 Long term (current) use of anticoagulants: Secondary | ICD-10-CM | POA: Diagnosis not present

## 2012-02-20 DIAGNOSIS — Z7901 Long term (current) use of anticoagulants: Secondary | ICD-10-CM | POA: Diagnosis not present

## 2012-02-20 DIAGNOSIS — D510 Vitamin B12 deficiency anemia due to intrinsic factor deficiency: Secondary | ICD-10-CM | POA: Diagnosis not present

## 2012-03-05 DIAGNOSIS — S139XXA Sprain of joints and ligaments of unspecified parts of neck, initial encounter: Secondary | ICD-10-CM | POA: Diagnosis not present

## 2012-03-19 DIAGNOSIS — Z7901 Long term (current) use of anticoagulants: Secondary | ICD-10-CM | POA: Diagnosis not present

## 2012-03-19 DIAGNOSIS — D510 Vitamin B12 deficiency anemia due to intrinsic factor deficiency: Secondary | ICD-10-CM | POA: Diagnosis not present

## 2012-03-21 DIAGNOSIS — Z950 Presence of cardiac pacemaker: Secondary | ICD-10-CM | POA: Diagnosis not present

## 2012-03-26 DIAGNOSIS — Z7901 Long term (current) use of anticoagulants: Secondary | ICD-10-CM | POA: Diagnosis not present

## 2012-04-18 DIAGNOSIS — Z950 Presence of cardiac pacemaker: Secondary | ICD-10-CM | POA: Diagnosis not present

## 2012-04-20 DIAGNOSIS — Z7901 Long term (current) use of anticoagulants: Secondary | ICD-10-CM | POA: Diagnosis not present

## 2012-04-20 DIAGNOSIS — D510 Vitamin B12 deficiency anemia due to intrinsic factor deficiency: Secondary | ICD-10-CM | POA: Diagnosis not present

## 2012-05-16 DIAGNOSIS — Z950 Presence of cardiac pacemaker: Secondary | ICD-10-CM | POA: Diagnosis not present

## 2012-05-25 DIAGNOSIS — Z7901 Long term (current) use of anticoagulants: Secondary | ICD-10-CM | POA: Diagnosis not present

## 2012-06-01 DIAGNOSIS — Z7901 Long term (current) use of anticoagulants: Secondary | ICD-10-CM | POA: Diagnosis not present

## 2012-06-13 DIAGNOSIS — Z950 Presence of cardiac pacemaker: Secondary | ICD-10-CM | POA: Diagnosis not present

## 2012-06-28 DIAGNOSIS — Z7901 Long term (current) use of anticoagulants: Secondary | ICD-10-CM | POA: Diagnosis not present

## 2012-06-28 DIAGNOSIS — D510 Vitamin B12 deficiency anemia due to intrinsic factor deficiency: Secondary | ICD-10-CM | POA: Diagnosis not present

## 2012-07-18 DIAGNOSIS — Z7901 Long term (current) use of anticoagulants: Secondary | ICD-10-CM | POA: Diagnosis not present

## 2012-07-18 DIAGNOSIS — I10 Essential (primary) hypertension: Secondary | ICD-10-CM | POA: Diagnosis not present

## 2012-07-18 DIAGNOSIS — I4891 Unspecified atrial fibrillation: Secondary | ICD-10-CM | POA: Diagnosis not present

## 2012-07-18 DIAGNOSIS — I2510 Atherosclerotic heart disease of native coronary artery without angina pectoris: Secondary | ICD-10-CM | POA: Diagnosis not present

## 2012-07-18 DIAGNOSIS — I359 Nonrheumatic aortic valve disorder, unspecified: Secondary | ICD-10-CM | POA: Diagnosis not present

## 2012-07-18 DIAGNOSIS — E785 Hyperlipidemia, unspecified: Secondary | ICD-10-CM | POA: Diagnosis not present

## 2012-07-18 DIAGNOSIS — Z950 Presence of cardiac pacemaker: Secondary | ICD-10-CM | POA: Diagnosis not present

## 2012-07-26 DIAGNOSIS — D510 Vitamin B12 deficiency anemia due to intrinsic factor deficiency: Secondary | ICD-10-CM | POA: Diagnosis not present

## 2012-07-26 DIAGNOSIS — I4891 Unspecified atrial fibrillation: Secondary | ICD-10-CM | POA: Diagnosis not present

## 2012-07-26 DIAGNOSIS — Z7901 Long term (current) use of anticoagulants: Secondary | ICD-10-CM | POA: Diagnosis not present

## 2012-08-15 DIAGNOSIS — Z950 Presence of cardiac pacemaker: Secondary | ICD-10-CM | POA: Diagnosis not present

## 2012-08-16 DIAGNOSIS — N200 Calculus of kidney: Secondary | ICD-10-CM | POA: Diagnosis not present

## 2012-08-16 DIAGNOSIS — N401 Enlarged prostate with lower urinary tract symptoms: Secondary | ICD-10-CM | POA: Diagnosis not present

## 2012-08-16 DIAGNOSIS — R310 Gross hematuria: Secondary | ICD-10-CM | POA: Diagnosis not present

## 2012-08-23 DIAGNOSIS — N200 Calculus of kidney: Secondary | ICD-10-CM | POA: Diagnosis not present

## 2012-08-29 DIAGNOSIS — R310 Gross hematuria: Secondary | ICD-10-CM | POA: Diagnosis not present

## 2012-08-29 DIAGNOSIS — N210 Calculus in bladder: Secondary | ICD-10-CM | POA: Diagnosis not present

## 2012-08-30 DIAGNOSIS — N200 Calculus of kidney: Secondary | ICD-10-CM | POA: Diagnosis not present

## 2012-08-30 DIAGNOSIS — Z7901 Long term (current) use of anticoagulants: Secondary | ICD-10-CM | POA: Diagnosis not present

## 2012-08-30 DIAGNOSIS — E7800 Pure hypercholesterolemia, unspecified: Secondary | ICD-10-CM | POA: Diagnosis not present

## 2012-08-30 DIAGNOSIS — N401 Enlarged prostate with lower urinary tract symptoms: Secondary | ICD-10-CM | POA: Diagnosis not present

## 2012-08-30 DIAGNOSIS — R310 Gross hematuria: Secondary | ICD-10-CM | POA: Diagnosis not present

## 2012-08-30 DIAGNOSIS — I10 Essential (primary) hypertension: Secondary | ICD-10-CM | POA: Diagnosis not present

## 2012-08-30 DIAGNOSIS — Z87891 Personal history of nicotine dependence: Secondary | ICD-10-CM | POA: Diagnosis not present

## 2012-08-30 DIAGNOSIS — I2510 Atherosclerotic heart disease of native coronary artery without angina pectoris: Secondary | ICD-10-CM | POA: Diagnosis not present

## 2012-08-30 DIAGNOSIS — N210 Calculus in bladder: Secondary | ICD-10-CM | POA: Diagnosis not present

## 2012-08-30 DIAGNOSIS — Z79899 Other long term (current) drug therapy: Secondary | ICD-10-CM | POA: Diagnosis not present

## 2012-08-31 DIAGNOSIS — D510 Vitamin B12 deficiency anemia due to intrinsic factor deficiency: Secondary | ICD-10-CM | POA: Diagnosis not present

## 2012-08-31 DIAGNOSIS — Z7901 Long term (current) use of anticoagulants: Secondary | ICD-10-CM | POA: Diagnosis not present

## 2012-09-03 DIAGNOSIS — I4891 Unspecified atrial fibrillation: Secondary | ICD-10-CM | POA: Diagnosis not present

## 2012-09-03 DIAGNOSIS — Z7901 Long term (current) use of anticoagulants: Secondary | ICD-10-CM | POA: Diagnosis not present

## 2012-09-04 DIAGNOSIS — N200 Calculus of kidney: Secondary | ICD-10-CM | POA: Diagnosis not present

## 2012-09-04 DIAGNOSIS — I10 Essential (primary) hypertension: Secondary | ICD-10-CM | POA: Diagnosis not present

## 2012-09-04 DIAGNOSIS — Z950 Presence of cardiac pacemaker: Secondary | ICD-10-CM | POA: Diagnosis not present

## 2012-09-04 DIAGNOSIS — E7800 Pure hypercholesterolemia, unspecified: Secondary | ICD-10-CM | POA: Diagnosis not present

## 2012-09-04 DIAGNOSIS — I2510 Atherosclerotic heart disease of native coronary artery without angina pectoris: Secondary | ICD-10-CM | POA: Diagnosis not present

## 2012-09-04 DIAGNOSIS — N210 Calculus in bladder: Secondary | ICD-10-CM | POA: Diagnosis not present

## 2012-09-04 DIAGNOSIS — N401 Enlarged prostate with lower urinary tract symptoms: Secondary | ICD-10-CM | POA: Diagnosis not present

## 2012-09-04 DIAGNOSIS — R310 Gross hematuria: Secondary | ICD-10-CM | POA: Diagnosis not present

## 2012-09-06 DIAGNOSIS — N210 Calculus in bladder: Secondary | ICD-10-CM | POA: Diagnosis not present

## 2012-09-12 DIAGNOSIS — I2510 Atherosclerotic heart disease of native coronary artery without angina pectoris: Secondary | ICD-10-CM | POA: Diagnosis not present

## 2012-09-12 DIAGNOSIS — Z7901 Long term (current) use of anticoagulants: Secondary | ICD-10-CM | POA: Diagnosis not present

## 2012-09-12 DIAGNOSIS — Z950 Presence of cardiac pacemaker: Secondary | ICD-10-CM | POA: Diagnosis not present

## 2012-09-12 DIAGNOSIS — I359 Nonrheumatic aortic valve disorder, unspecified: Secondary | ICD-10-CM | POA: Diagnosis not present

## 2012-09-12 DIAGNOSIS — I4891 Unspecified atrial fibrillation: Secondary | ICD-10-CM | POA: Diagnosis not present

## 2012-09-12 DIAGNOSIS — I10 Essential (primary) hypertension: Secondary | ICD-10-CM | POA: Diagnosis not present

## 2012-09-12 DIAGNOSIS — E785 Hyperlipidemia, unspecified: Secondary | ICD-10-CM | POA: Diagnosis not present

## 2012-09-26 DIAGNOSIS — Z125 Encounter for screening for malignant neoplasm of prostate: Secondary | ICD-10-CM | POA: Diagnosis not present

## 2012-09-26 DIAGNOSIS — N401 Enlarged prostate with lower urinary tract symptoms: Secondary | ICD-10-CM | POA: Diagnosis not present

## 2012-09-28 DIAGNOSIS — Z7901 Long term (current) use of anticoagulants: Secondary | ICD-10-CM | POA: Diagnosis not present

## 2012-09-28 DIAGNOSIS — M899 Disorder of bone, unspecified: Secondary | ICD-10-CM | POA: Diagnosis not present

## 2012-09-28 DIAGNOSIS — I10 Essential (primary) hypertension: Secondary | ICD-10-CM | POA: Diagnosis not present

## 2012-09-28 DIAGNOSIS — M949 Disorder of cartilage, unspecified: Secondary | ICD-10-CM | POA: Diagnosis not present

## 2012-09-28 DIAGNOSIS — Z006 Encounter for examination for normal comparison and control in clinical research program: Secondary | ICD-10-CM | POA: Diagnosis not present

## 2012-09-28 DIAGNOSIS — I4891 Unspecified atrial fibrillation: Secondary | ICD-10-CM | POA: Diagnosis not present

## 2012-09-28 DIAGNOSIS — Z79899 Other long term (current) drug therapy: Secondary | ICD-10-CM | POA: Diagnosis not present

## 2012-10-01 DIAGNOSIS — Z7901 Long term (current) use of anticoagulants: Secondary | ICD-10-CM | POA: Diagnosis not present

## 2012-10-01 DIAGNOSIS — I4891 Unspecified atrial fibrillation: Secondary | ICD-10-CM | POA: Diagnosis not present

## 2012-10-05 DIAGNOSIS — I4891 Unspecified atrial fibrillation: Secondary | ICD-10-CM | POA: Diagnosis not present

## 2012-10-05 DIAGNOSIS — Z7901 Long term (current) use of anticoagulants: Secondary | ICD-10-CM | POA: Diagnosis not present

## 2012-10-10 DIAGNOSIS — I2510 Atherosclerotic heart disease of native coronary artery without angina pectoris: Secondary | ICD-10-CM | POA: Diagnosis not present

## 2012-10-10 DIAGNOSIS — I10 Essential (primary) hypertension: Secondary | ICD-10-CM | POA: Diagnosis not present

## 2012-10-10 DIAGNOSIS — I359 Nonrheumatic aortic valve disorder, unspecified: Secondary | ICD-10-CM | POA: Diagnosis not present

## 2012-10-10 DIAGNOSIS — E785 Hyperlipidemia, unspecified: Secondary | ICD-10-CM | POA: Diagnosis not present

## 2012-10-10 DIAGNOSIS — I4891 Unspecified atrial fibrillation: Secondary | ICD-10-CM | POA: Diagnosis not present

## 2012-10-10 DIAGNOSIS — Z950 Presence of cardiac pacemaker: Secondary | ICD-10-CM | POA: Diagnosis not present

## 2012-10-10 DIAGNOSIS — Z7901 Long term (current) use of anticoagulants: Secondary | ICD-10-CM | POA: Diagnosis not present

## 2012-10-26 DIAGNOSIS — I4891 Unspecified atrial fibrillation: Secondary | ICD-10-CM | POA: Diagnosis not present

## 2012-10-26 DIAGNOSIS — Z7901 Long term (current) use of anticoagulants: Secondary | ICD-10-CM | POA: Diagnosis not present

## 2012-10-26 DIAGNOSIS — D510 Vitamin B12 deficiency anemia due to intrinsic factor deficiency: Secondary | ICD-10-CM | POA: Diagnosis not present

## 2012-11-07 DIAGNOSIS — N210 Calculus in bladder: Secondary | ICD-10-CM | POA: Diagnosis not present

## 2012-11-07 DIAGNOSIS — Z950 Presence of cardiac pacemaker: Secondary | ICD-10-CM | POA: Diagnosis not present

## 2012-11-07 DIAGNOSIS — N401 Enlarged prostate with lower urinary tract symptoms: Secondary | ICD-10-CM | POA: Diagnosis not present

## 2012-11-23 DIAGNOSIS — D510 Vitamin B12 deficiency anemia due to intrinsic factor deficiency: Secondary | ICD-10-CM | POA: Diagnosis not present

## 2012-11-23 DIAGNOSIS — I4891 Unspecified atrial fibrillation: Secondary | ICD-10-CM | POA: Diagnosis not present

## 2012-11-30 DIAGNOSIS — Z7901 Long term (current) use of anticoagulants: Secondary | ICD-10-CM | POA: Diagnosis not present

## 2012-11-30 DIAGNOSIS — I4891 Unspecified atrial fibrillation: Secondary | ICD-10-CM | POA: Diagnosis not present

## 2012-12-05 DIAGNOSIS — Z950 Presence of cardiac pacemaker: Secondary | ICD-10-CM | POA: Diagnosis not present

## 2012-12-06 DIAGNOSIS — L820 Inflamed seborrheic keratosis: Secondary | ICD-10-CM | POA: Diagnosis not present

## 2012-12-06 DIAGNOSIS — C44519 Basal cell carcinoma of skin of other part of trunk: Secondary | ICD-10-CM | POA: Diagnosis not present

## 2013-01-01 DIAGNOSIS — I359 Nonrheumatic aortic valve disorder, unspecified: Secondary | ICD-10-CM | POA: Diagnosis not present

## 2013-01-01 DIAGNOSIS — I10 Essential (primary) hypertension: Secondary | ICD-10-CM | POA: Diagnosis not present

## 2013-01-01 DIAGNOSIS — E785 Hyperlipidemia, unspecified: Secondary | ICD-10-CM | POA: Diagnosis not present

## 2013-01-01 DIAGNOSIS — I2510 Atherosclerotic heart disease of native coronary artery without angina pectoris: Secondary | ICD-10-CM | POA: Diagnosis not present

## 2013-01-01 DIAGNOSIS — Z7901 Long term (current) use of anticoagulants: Secondary | ICD-10-CM | POA: Diagnosis not present

## 2013-01-01 DIAGNOSIS — Z950 Presence of cardiac pacemaker: Secondary | ICD-10-CM | POA: Diagnosis not present

## 2013-01-01 DIAGNOSIS — I4891 Unspecified atrial fibrillation: Secondary | ICD-10-CM | POA: Diagnosis not present

## 2013-01-04 DIAGNOSIS — I4891 Unspecified atrial fibrillation: Secondary | ICD-10-CM | POA: Diagnosis not present

## 2013-01-04 DIAGNOSIS — D510 Vitamin B12 deficiency anemia due to intrinsic factor deficiency: Secondary | ICD-10-CM | POA: Diagnosis not present

## 2013-01-04 DIAGNOSIS — Z7901 Long term (current) use of anticoagulants: Secondary | ICD-10-CM | POA: Diagnosis not present

## 2013-01-07 DIAGNOSIS — C44519 Basal cell carcinoma of skin of other part of trunk: Secondary | ICD-10-CM | POA: Diagnosis not present

## 2013-01-18 DIAGNOSIS — I4891 Unspecified atrial fibrillation: Secondary | ICD-10-CM | POA: Diagnosis not present

## 2013-01-18 DIAGNOSIS — Z7901 Long term (current) use of anticoagulants: Secondary | ICD-10-CM | POA: Diagnosis not present

## 2013-01-30 DIAGNOSIS — Z950 Presence of cardiac pacemaker: Secondary | ICD-10-CM | POA: Diagnosis not present

## 2013-02-15 DIAGNOSIS — I4891 Unspecified atrial fibrillation: Secondary | ICD-10-CM | POA: Diagnosis not present

## 2013-02-15 DIAGNOSIS — Z7901 Long term (current) use of anticoagulants: Secondary | ICD-10-CM | POA: Diagnosis not present

## 2013-02-15 DIAGNOSIS — D510 Vitamin B12 deficiency anemia due to intrinsic factor deficiency: Secondary | ICD-10-CM | POA: Diagnosis not present

## 2013-02-21 DIAGNOSIS — Z7901 Long term (current) use of anticoagulants: Secondary | ICD-10-CM | POA: Diagnosis not present

## 2013-02-21 DIAGNOSIS — I4891 Unspecified atrial fibrillation: Secondary | ICD-10-CM | POA: Diagnosis not present

## 2013-02-25 DIAGNOSIS — Z7901 Long term (current) use of anticoagulants: Secondary | ICD-10-CM | POA: Diagnosis not present

## 2013-02-25 DIAGNOSIS — I4891 Unspecified atrial fibrillation: Secondary | ICD-10-CM | POA: Diagnosis not present

## 2013-02-27 DIAGNOSIS — Z950 Presence of cardiac pacemaker: Secondary | ICD-10-CM | POA: Diagnosis not present

## 2013-03-08 DIAGNOSIS — N210 Calculus in bladder: Secondary | ICD-10-CM | POA: Diagnosis not present

## 2013-03-08 DIAGNOSIS — N200 Calculus of kidney: Secondary | ICD-10-CM | POA: Diagnosis not present

## 2013-03-08 DIAGNOSIS — N401 Enlarged prostate with lower urinary tract symptoms: Secondary | ICD-10-CM | POA: Diagnosis not present

## 2013-03-11 DIAGNOSIS — N200 Calculus of kidney: Secondary | ICD-10-CM | POA: Diagnosis not present

## 2013-03-11 DIAGNOSIS — N210 Calculus in bladder: Secondary | ICD-10-CM | POA: Diagnosis not present

## 2013-03-15 DIAGNOSIS — D510 Vitamin B12 deficiency anemia due to intrinsic factor deficiency: Secondary | ICD-10-CM | POA: Diagnosis not present

## 2013-03-15 DIAGNOSIS — Z7901 Long term (current) use of anticoagulants: Secondary | ICD-10-CM | POA: Diagnosis not present

## 2013-03-19 DIAGNOSIS — N401 Enlarged prostate with lower urinary tract symptoms: Secondary | ICD-10-CM | POA: Diagnosis not present

## 2013-03-19 DIAGNOSIS — N210 Calculus in bladder: Secondary | ICD-10-CM | POA: Diagnosis not present

## 2013-03-21 DIAGNOSIS — Z7901 Long term (current) use of anticoagulants: Secondary | ICD-10-CM | POA: Diagnosis not present

## 2013-04-02 DIAGNOSIS — N3289 Other specified disorders of bladder: Secondary | ICD-10-CM | POA: Diagnosis not present

## 2013-04-02 DIAGNOSIS — E7800 Pure hypercholesterolemia, unspecified: Secondary | ICD-10-CM | POA: Diagnosis not present

## 2013-04-02 DIAGNOSIS — Z79899 Other long term (current) drug therapy: Secondary | ICD-10-CM | POA: Diagnosis not present

## 2013-04-02 DIAGNOSIS — Z950 Presence of cardiac pacemaker: Secondary | ICD-10-CM | POA: Diagnosis not present

## 2013-04-02 DIAGNOSIS — N210 Calculus in bladder: Secondary | ICD-10-CM | POA: Diagnosis not present

## 2013-04-02 DIAGNOSIS — Z87891 Personal history of nicotine dependence: Secondary | ICD-10-CM | POA: Diagnosis not present

## 2013-04-02 DIAGNOSIS — N401 Enlarged prostate with lower urinary tract symptoms: Secondary | ICD-10-CM | POA: Diagnosis not present

## 2013-04-02 DIAGNOSIS — I10 Essential (primary) hypertension: Secondary | ICD-10-CM | POA: Diagnosis not present

## 2013-04-02 DIAGNOSIS — Z7901 Long term (current) use of anticoagulants: Secondary | ICD-10-CM | POA: Diagnosis not present

## 2013-04-12 DIAGNOSIS — D510 Vitamin B12 deficiency anemia due to intrinsic factor deficiency: Secondary | ICD-10-CM | POA: Diagnosis not present

## 2013-04-12 DIAGNOSIS — Z7901 Long term (current) use of anticoagulants: Secondary | ICD-10-CM | POA: Diagnosis not present

## 2013-04-15 DIAGNOSIS — Z7901 Long term (current) use of anticoagulants: Secondary | ICD-10-CM | POA: Diagnosis not present

## 2013-04-19 DIAGNOSIS — Z7901 Long term (current) use of anticoagulants: Secondary | ICD-10-CM | POA: Diagnosis not present

## 2013-04-23 DIAGNOSIS — Z950 Presence of cardiac pacemaker: Secondary | ICD-10-CM | POA: Diagnosis not present

## 2013-05-09 DIAGNOSIS — Z7901 Long term (current) use of anticoagulants: Secondary | ICD-10-CM | POA: Diagnosis not present

## 2013-05-22 DIAGNOSIS — Z950 Presence of cardiac pacemaker: Secondary | ICD-10-CM | POA: Diagnosis not present

## 2013-06-07 DIAGNOSIS — Z7901 Long term (current) use of anticoagulants: Secondary | ICD-10-CM | POA: Diagnosis not present

## 2013-06-07 DIAGNOSIS — D510 Vitamin B12 deficiency anemia due to intrinsic factor deficiency: Secondary | ICD-10-CM | POA: Diagnosis not present

## 2013-07-05 DIAGNOSIS — D510 Vitamin B12 deficiency anemia due to intrinsic factor deficiency: Secondary | ICD-10-CM | POA: Diagnosis not present

## 2013-07-05 DIAGNOSIS — Z7901 Long term (current) use of anticoagulants: Secondary | ICD-10-CM | POA: Diagnosis not present

## 2013-07-09 DIAGNOSIS — I2510 Atherosclerotic heart disease of native coronary artery without angina pectoris: Secondary | ICD-10-CM | POA: Diagnosis not present

## 2013-07-09 DIAGNOSIS — I10 Essential (primary) hypertension: Secondary | ICD-10-CM | POA: Diagnosis not present

## 2013-07-09 DIAGNOSIS — Z7901 Long term (current) use of anticoagulants: Secondary | ICD-10-CM | POA: Diagnosis not present

## 2013-07-09 DIAGNOSIS — I359 Nonrheumatic aortic valve disorder, unspecified: Secondary | ICD-10-CM | POA: Diagnosis not present

## 2013-07-09 DIAGNOSIS — I4891 Unspecified atrial fibrillation: Secondary | ICD-10-CM | POA: Diagnosis not present

## 2013-07-09 DIAGNOSIS — E785 Hyperlipidemia, unspecified: Secondary | ICD-10-CM | POA: Diagnosis not present

## 2013-07-09 DIAGNOSIS — Z950 Presence of cardiac pacemaker: Secondary | ICD-10-CM | POA: Diagnosis not present

## 2013-07-19 DIAGNOSIS — Z7901 Long term (current) use of anticoagulants: Secondary | ICD-10-CM | POA: Diagnosis not present

## 2013-08-09 DIAGNOSIS — Z7901 Long term (current) use of anticoagulants: Secondary | ICD-10-CM | POA: Diagnosis not present

## 2013-08-09 DIAGNOSIS — I359 Nonrheumatic aortic valve disorder, unspecified: Secondary | ICD-10-CM | POA: Diagnosis not present

## 2013-08-09 DIAGNOSIS — I4891 Unspecified atrial fibrillation: Secondary | ICD-10-CM | POA: Diagnosis not present

## 2013-08-09 DIAGNOSIS — I2510 Atherosclerotic heart disease of native coronary artery without angina pectoris: Secondary | ICD-10-CM | POA: Diagnosis not present

## 2013-08-09 DIAGNOSIS — D510 Vitamin B12 deficiency anemia due to intrinsic factor deficiency: Secondary | ICD-10-CM | POA: Diagnosis not present

## 2013-08-09 DIAGNOSIS — Z950 Presence of cardiac pacemaker: Secondary | ICD-10-CM | POA: Diagnosis not present

## 2013-08-09 DIAGNOSIS — E785 Hyperlipidemia, unspecified: Secondary | ICD-10-CM | POA: Diagnosis not present

## 2013-08-09 DIAGNOSIS — I10 Essential (primary) hypertension: Secondary | ICD-10-CM | POA: Diagnosis not present

## 2013-08-13 DIAGNOSIS — Z950 Presence of cardiac pacemaker: Secondary | ICD-10-CM | POA: Diagnosis not present

## 2013-09-05 DIAGNOSIS — Z7901 Long term (current) use of anticoagulants: Secondary | ICD-10-CM | POA: Diagnosis not present

## 2013-09-05 DIAGNOSIS — N210 Calculus in bladder: Secondary | ICD-10-CM | POA: Diagnosis not present

## 2013-09-05 DIAGNOSIS — N401 Enlarged prostate with lower urinary tract symptoms: Secondary | ICD-10-CM | POA: Diagnosis not present

## 2013-09-05 DIAGNOSIS — N200 Calculus of kidney: Secondary | ICD-10-CM | POA: Diagnosis not present

## 2013-09-08 DIAGNOSIS — Z950 Presence of cardiac pacemaker: Secondary | ICD-10-CM | POA: Diagnosis not present

## 2013-09-08 DIAGNOSIS — I4891 Unspecified atrial fibrillation: Secondary | ICD-10-CM | POA: Diagnosis not present

## 2013-09-08 DIAGNOSIS — I2510 Atherosclerotic heart disease of native coronary artery without angina pectoris: Secondary | ICD-10-CM | POA: Diagnosis not present

## 2013-09-08 DIAGNOSIS — Z7901 Long term (current) use of anticoagulants: Secondary | ICD-10-CM | POA: Diagnosis not present

## 2013-09-08 DIAGNOSIS — I10 Essential (primary) hypertension: Secondary | ICD-10-CM | POA: Diagnosis not present

## 2013-09-08 DIAGNOSIS — I359 Nonrheumatic aortic valve disorder, unspecified: Secondary | ICD-10-CM | POA: Diagnosis not present

## 2013-09-08 DIAGNOSIS — E785 Hyperlipidemia, unspecified: Secondary | ICD-10-CM | POA: Diagnosis not present

## 2013-09-19 DIAGNOSIS — N210 Calculus in bladder: Secondary | ICD-10-CM | POA: Diagnosis not present

## 2013-09-19 DIAGNOSIS — N401 Enlarged prostate with lower urinary tract symptoms: Secondary | ICD-10-CM | POA: Diagnosis not present

## 2013-10-03 DIAGNOSIS — D510 Vitamin B12 deficiency anemia due to intrinsic factor deficiency: Secondary | ICD-10-CM | POA: Diagnosis not present

## 2013-10-03 DIAGNOSIS — Z7901 Long term (current) use of anticoagulants: Secondary | ICD-10-CM | POA: Diagnosis not present

## 2013-10-09 DIAGNOSIS — Z950 Presence of cardiac pacemaker: Secondary | ICD-10-CM | POA: Diagnosis not present

## 2013-11-04 DIAGNOSIS — Z7901 Long term (current) use of anticoagulants: Secondary | ICD-10-CM | POA: Diagnosis not present

## 2013-11-04 DIAGNOSIS — D510 Vitamin B12 deficiency anemia due to intrinsic factor deficiency: Secondary | ICD-10-CM | POA: Diagnosis not present

## 2013-11-13 DIAGNOSIS — IMO0001 Reserved for inherently not codable concepts without codable children: Secondary | ICD-10-CM | POA: Diagnosis not present

## 2013-11-13 DIAGNOSIS — L02219 Cutaneous abscess of trunk, unspecified: Secondary | ICD-10-CM | POA: Diagnosis not present

## 2013-11-13 DIAGNOSIS — L03319 Cellulitis of trunk, unspecified: Secondary | ICD-10-CM | POA: Diagnosis not present

## 2013-12-04 DIAGNOSIS — Z950 Presence of cardiac pacemaker: Secondary | ICD-10-CM | POA: Diagnosis not present

## 2013-12-06 DIAGNOSIS — D510 Vitamin B12 deficiency anemia due to intrinsic factor deficiency: Secondary | ICD-10-CM | POA: Diagnosis not present

## 2013-12-06 DIAGNOSIS — Z7901 Long term (current) use of anticoagulants: Secondary | ICD-10-CM | POA: Diagnosis not present

## 2013-12-31 DIAGNOSIS — Z125 Encounter for screening for malignant neoplasm of prostate: Secondary | ICD-10-CM | POA: Diagnosis not present

## 2013-12-31 DIAGNOSIS — N210 Calculus in bladder: Secondary | ICD-10-CM | POA: Diagnosis not present

## 2013-12-31 DIAGNOSIS — N200 Calculus of kidney: Secondary | ICD-10-CM | POA: Diagnosis not present

## 2013-12-31 DIAGNOSIS — N138 Other obstructive and reflux uropathy: Secondary | ICD-10-CM | POA: Diagnosis not present

## 2013-12-31 DIAGNOSIS — N401 Enlarged prostate with lower urinary tract symptoms: Secondary | ICD-10-CM | POA: Diagnosis not present

## 2014-01-07 ENCOUNTER — Encounter: Payer: Self-pay | Admitting: Cardiology

## 2014-01-07 DIAGNOSIS — I4891 Unspecified atrial fibrillation: Secondary | ICD-10-CM | POA: Diagnosis not present

## 2014-01-07 DIAGNOSIS — I2510 Atherosclerotic heart disease of native coronary artery without angina pectoris: Secondary | ICD-10-CM | POA: Diagnosis not present

## 2014-01-07 DIAGNOSIS — I480 Paroxysmal atrial fibrillation: Secondary | ICD-10-CM | POA: Insufficient documentation

## 2014-01-07 DIAGNOSIS — E785 Hyperlipidemia, unspecified: Secondary | ICD-10-CM

## 2014-01-07 DIAGNOSIS — Z950 Presence of cardiac pacemaker: Secondary | ICD-10-CM

## 2014-01-07 DIAGNOSIS — Z7901 Long term (current) use of anticoagulants: Secondary | ICD-10-CM

## 2014-01-07 DIAGNOSIS — I359 Nonrheumatic aortic valve disorder, unspecified: Secondary | ICD-10-CM | POA: Insufficient documentation

## 2014-01-07 DIAGNOSIS — I10 Essential (primary) hypertension: Secondary | ICD-10-CM | POA: Diagnosis not present

## 2014-01-07 HISTORY — DX: Long term (current) use of anticoagulants: Z79.01

## 2014-01-07 HISTORY — DX: Hyperlipidemia, unspecified: E78.5

## 2014-01-07 HISTORY — DX: Presence of cardiac pacemaker: Z95.0

## 2014-01-07 NOTE — Progress Notes (Signed)
Patient ID: [name], male   DOB: [birth year], 80 y.o.   MRN: [id number]    Bradley, Fox  Date of visit:  01/07/2014 DOB:  09/19/1916    Age:  89 yrs. Medical record number:  [id number]     Account number:  [id number] Primary Care Provider: Lovette Cliche ____________________________ CURRENT DIAGNOSES  1. CAD,Native  2. Arrhythmia-Atrial Fibrillation  3. Aortic Valve Disorder  4. Hyperlipidemia-mixed disorder  5. Hypertension  6. Cardiac Pacemaker In Situ  7. Long Term Use Anticoagulant  8. Third Degree Heart Block ____________________________ ALLERGIES  No Known Drug Allergies ____________________________ MEDICATIONS  1. cyanocobalamin (vitamin B-12) 1,000 mcg/mL Solution, q month  2. warfarin 3 mg Tablet, Take as directed  3. metoprolol succinate 25 mg Tablet Sustained Release 24 hr, 1 p.o. daily  4. Vytorin 10-40 10-40 mg Tablet, 1 p.o. q.d.  5. felodipine 2.5 mg Tablet Extended Release 24 hr, 1 p.o. daily ____________________________ CHIEF COMPLAINTS  Followup of Arrhythmia-Atrial Fibrillation  Followup of CAD,Native ____________________________ HISTORY OF PRESENT ILLNESS  Patient seen for cardiac followup. He has had a good 6 months since he was here but continues to have problems with both his vision and hearing. He does not have any angina and denies PND, orthopnea, syncope, or claudication. He does mention some bladder stones. His pacemaker was evaluated and shows less than 1 month left on his battery. He has had no bleeding complications from warfarin. He has had no recurrent syncope since he was here. ____________________________ PAST HISTORY  Past Medical Illnesses:  hypertension, hyperlipidemia, anemia-pernicious, nephrolithiasis;  Cardiovascular Illnesses:  aortic sclerosis, aortic insufficiency, conduction disorder-CHB, atrial fibrillation, CAD;  Surgical Procedures:  appendectomy, inguinal herniorrhaphy-rt, nasal surgery, Medtronics pacer insertion 06/27/03 Dr.  Lovena Le;  NYHA Classification:  II;  Cardiology Procedures-Invasive:  cardiac cath (left) February 2005;  Cardiology Procedures-Noninvasive:  echocardiogram August 2004;  Cardiac Cath Results:  normal Left main, 40% stenosis proximal LAD, 90% stenosis distal LAD, 40% stenosis CFX, 30% stenosis RCA;  LVEF of 45% documented via cardiac cath on 06/27/2003,  CHADS Score:  2,  CHA2DS2-VASC Score:  4 ____________________________ CARDIO-PULMONARY TEST DATES EKG Date:  06/18/2012;   Cardiac Cath Date:  06/27/2003;  Echocardiography Date: 12/31/2002;  Chest Xray Date: 06/10/2008;   ____________________________ FAMILY HISTORY Father -- Father dead, Death due to natural cause Mother -- Mother dead, Dementia/Alzheimers Sister -- Sister dead, Death due to natural cause Sister -- Sister dead, Bowel cancer ____________________________ SOCIAL HISTORY Alcohol Use:  socially;  Smoking:  used to smoke but quit Prior to 1980, pipe;  Diet:  regular diet;  Lifestyle:  married;  Exercise:  no regular exercise;  Occupation:  retired and [employer];  Residence:  lives with wife;   ____________________________ REVIEW OF SYSTEMS General:  mild malaise and fatigue Eyes: decreased acuity Respiratory: mild dyspnea with exertion Cardiovascular:  please review HPI Abdominal: constipation Genitourinary-Male: nocturia  Musculoskeletal:  mild arthritis Neurological:  denies headaches, stroke, or TIA  ____________________________ PHYSICAL EXAMINATION VITAL SIGNS  Blood Pressure:  104/60 Sitting, Left arm, regular cuff  , 100/62 Standing, Left arm and regular cuff   Pulse:  70/min. Weight:  140.00 lbs. Height:  65"BMI: 23  Constitutional:  pleasant white male in no acute distress, in no acute distress, looks stated age Skin:  multiple sebaceous cysts Head:  normocephalic, balding male hair pattern ENT:  ears, nose and throat unremarkable, edentulous, full mouth dentures present Neck:  supple, no masses, thyromegaly,  JVD. Carotid pulses are full and  equal bilaterally without bruits. Chest:  healed pacemaker incision in the left pectoral area, clear to auscultation Cardiac:  regular rhythm, S1 normal, S2 normal, 1/6 systolic murmur left sternal border Peripheral Pulses:  pulses full and equal in all extremities Extremities & Back:  no deformities, clubbing, cyanosis, erythema or edema observed. Normal muscle strength and tone. Neurological:  no gross motor or sensory deficits noted, affect appropriate, oriented x3. ____________________________ MOST RECENT LIPID PANEL 05/06/11  CHOL TOTL 157 mg/dl, LDL 67 calc, HDL 60 mg/dl, TRIGLYCER 150 mg/dl and CHOL/HDL 2.6 (Calc) ____________________________ IMPRESSIONS/PLAN  1. Functioning permanent pacemaker which is very close to elective replacement 2. Coronary artery disease no angina 3. History of atrial fibrillation intermittent and now stable 4. Long-term anticoagulation with warfarin stable  Recommendations:  Plan to ask Dr Lovena Le to see as he will likely require a generator change soon.  Other wise followup here 6 months.  ____________________________ TODAYS ORDERS  1. Return Visit: 6 months                       ____________________________ Cardiology Physician:  Kerry Hough MD [facility]

## 2014-01-20 ENCOUNTER — Encounter: Payer: Self-pay | Admitting: Internal Medicine

## 2014-01-20 ENCOUNTER — Other Ambulatory Visit: Payer: Self-pay

## 2014-01-20 ENCOUNTER — Encounter: Payer: Self-pay | Admitting: *Deleted

## 2014-01-20 ENCOUNTER — Ambulatory Visit (INDEPENDENT_AMBULATORY_CARE_PROVIDER_SITE_OTHER): Payer: Medicare Other | Admitting: Internal Medicine

## 2014-01-20 VITALS — BP 138/64 | HR 60 | Ht 64.0 in | Wt 140.8 lb

## 2014-01-20 DIAGNOSIS — I4891 Unspecified atrial fibrillation: Secondary | ICD-10-CM

## 2014-01-20 DIAGNOSIS — Z950 Presence of cardiac pacemaker: Secondary | ICD-10-CM

## 2014-01-20 DIAGNOSIS — I2510 Atherosclerotic heart disease of native coronary artery without angina pectoris: Secondary | ICD-10-CM

## 2014-01-20 DIAGNOSIS — Z5181 Encounter for therapeutic drug level monitoring: Secondary | ICD-10-CM

## 2014-01-20 DIAGNOSIS — I442 Atrioventricular block, complete: Secondary | ICD-10-CM

## 2014-01-20 DIAGNOSIS — Z45018 Encounter for adjustment and management of other part of cardiac pacemaker: Secondary | ICD-10-CM | POA: Diagnosis not present

## 2014-01-20 DIAGNOSIS — I480 Paroxysmal atrial fibrillation: Secondary | ICD-10-CM

## 2014-01-20 DIAGNOSIS — Z01812 Encounter for preprocedural laboratory examination: Secondary | ICD-10-CM

## 2014-01-20 DIAGNOSIS — Z45010 Encounter for checking and testing of cardiac pacemaker pulse generator [battery]: Secondary | ICD-10-CM

## 2014-01-20 DIAGNOSIS — I482 Chronic atrial fibrillation, unspecified: Secondary | ICD-10-CM

## 2014-01-20 LAB — CBC WITH DIFFERENTIAL/PLATELET
BASOS ABS: 0 10*3/uL (ref 0.0–0.1)
Basophils Relative: 0.3 % (ref 0.0–3.0)
EOS PCT: 1.6 % (ref 0.0–5.0)
Eosinophils Absolute: 0.1 10*3/uL (ref 0.0–0.7)
HCT: 41.8 % (ref 39.0–52.0)
Hemoglobin: 14 g/dL (ref 13.0–17.0)
LYMPHS ABS: 1.9 10*3/uL (ref 0.7–4.0)
Lymphocytes Relative: 24.9 % (ref 12.0–46.0)
MCHC: 33.4 g/dL (ref 30.0–36.0)
MCV: 93.5 fl (ref 78.0–100.0)
MONOS PCT: 7.3 % (ref 3.0–12.0)
Monocytes Absolute: 0.6 10*3/uL (ref 0.1–1.0)
NEUTROS PCT: 65.9 % (ref 43.0–77.0)
Neutro Abs: 5.1 10*3/uL (ref 1.4–7.7)
PLATELETS: 153 10*3/uL (ref 150.0–400.0)
RBC: 4.47 Mil/uL (ref 4.22–5.81)
RDW: 13.9 % (ref 11.5–15.5)
WBC: 7.7 10*3/uL (ref 4.0–10.5)

## 2014-01-20 LAB — BASIC METABOLIC PANEL
BUN: 29 mg/dL — AB (ref 6–23)
CALCIUM: 9.8 mg/dL (ref 8.4–10.5)
CO2: 25 mEq/L (ref 19–32)
CREATININE: 1 mg/dL (ref 0.4–1.5)
Chloride: 112 mEq/L (ref 96–112)
GFR: 72.93 mL/min (ref >60.00)
GLUCOSE: 115 mg/dL — AB (ref 70–99)
POTASSIUM: 4.3 meq/L (ref 3.5–5.1)
Sodium: 143 mEq/L (ref 135–145)

## 2014-01-20 LAB — PROTIME-INR
INR: 3.1 ratio — AB (ref 0.8–1.0)
PROTHROMBIN TIME: 33.5 s — AB (ref 9.6–13.1)

## 2014-01-20 NOTE — Assessment & Plan Note (Signed)
He denies anginal symptoms. 

## 2014-01-20 NOTE — Progress Notes (Signed)
      HPI [name] is referred today by Dr. Wynonia Lawman after a long absence from our arrhythmia clinic. He is a very pleasant 80 year old man with a history of complete heart block, status post permanent pacemaker insertion, who also has atrial fibrillation, and hypertension. He has reached elective replacement. The patient has done amazingly well and denies chest pain, shortness of breath, or syncope. No peripheral edema. He is been maintained on warfarin for thromboembolic prevention in the setting of paroxysmal atrial fibrillation. No Known Allergies   Current Outpatient Prescriptions  Medication Sig Dispense Refill  . ezetimibe-simvastatin (VYTORIN) 10-40 MG per tablet Take 1 tablet by mouth daily.      . felodipine (PLENDIL) 2.5 MG 24 hr tablet Take 2.5 mg by mouth daily.      . metoprolol succinate (TOPROL-XL) 25 MG 24 hr tablet Take 25 mg by mouth daily.      . tamsulosin (FLOMAX) 0.4 MG CAPS capsule Take 0.4 mg by mouth daily.      Marland Kitchen warfarin (COUMADIN) 3 MG tablet Take 3 mg by mouth daily.       No current facility-administered medications for this visit.     Past Medical History  Diagnosis Date  . Cardiac pacemaker in situ 01/07/2014    Pacer insertion for third degree block 06/27/03 Dr. Lovena Le  RA lead Medtronic 5076 RV lead Medtronic 5076 serial number: [id number]   Medtronic Sigma   [id number]  . Atrial fibrillation   . CAD (coronary artery disease), native coronary artery   . Essential hypertension   . Hyperlipidemia 01/07/2014  . Long term (current) use of anticoagulants 01/07/2014  . Pernicious anemia   . Nephrolithiasis   . Bladder stones     ROS:   All systems reviewed and negative except as noted in the HPI.   Past Surgical History  Procedure Laterality Date  . Appendectomy    . Inguinal hernia repair    . Pacemaker insertion    . Nasal sinus surgery       History reviewed. No pertinent family history.   History   Social History  . Marital Status:  Married    Spouse Name: N/A    Number of Children: N/A  . Years of Education: N/A   Occupational History  . Not on file.   Social History Main Topics  . Smoking status: Never Smoker   . Smokeless tobacco: Never Used  . Alcohol Use: No  . Drug Use: No  . Sexual Activity: No   Other Topics Concern  . Not on file   Social History Narrative  . No narrative on file     BP 138/64  Pulse 60  Ht 5\' 4"  (1.626 m)  Wt 140 lb 12.8 oz (63.866 kg)  BMI 24.16 kg/m2  Physical Exam:  Well appearing elderly man, NAD HEENT: Unremarkable Neck:  No JVD, no thyromegally Back:  No CVA tenderness Lungs:  Clear with no wheezes, rales, or rhonchi. HEART:  Regular rate rhythm, no murmurs, no rubs, no clicks Abd:  soft, positive bowel sounds, no organomegally, no rebound, no guarding Ext:  2 plus pulses, no edema, no cyanosis, no clubbing Skin:  No rashes no nodules Neuro:  CN II through XII intact, motor grossly intact  EKG - normal sinus rhythm with AV sequential pacing  DEVICE  Normal device function.  See PaceArt for details. Medtronic device at elective replacement  Assess/Plan:

## 2014-01-20 NOTE — Patient Instructions (Signed)
Your physician recommends that you continue on your current medications as directed. Please refer to the Current Medication list given to you today.  Please hold Coumadin the night before your procedure  Pre-procedure labs today  Your physician has recommended that you have a pacemaker generator change. Please see the instruction sheet given to you today for more information.  Your wound check is scheduled for 02/10/14 at 11:00 a.m. at [address]

## 2014-01-20 NOTE — Assessment & Plan Note (Signed)
His Medtronic DDD PM is at [facility]. I have recommended proceeding with PPM insertion. This will be schedule in the next few weeks.

## 2014-01-20 NOTE — Addendum Note (Signed)
Addended by: Avie Echevaria on: 01/20/2014 12:36 PM   Modules accepted: Orders

## 2014-01-22 ENCOUNTER — Encounter: Payer: Self-pay | Admitting: Cardiology

## 2014-01-27 ENCOUNTER — Encounter (HOSPITAL_COMMUNITY): Payer: Self-pay | Admitting: Pharmacy Technician

## 2014-01-27 DIAGNOSIS — D510 Vitamin B12 deficiency anemia due to intrinsic factor deficiency: Secondary | ICD-10-CM | POA: Diagnosis not present

## 2014-01-27 DIAGNOSIS — Z7901 Long term (current) use of anticoagulants: Secondary | ICD-10-CM | POA: Diagnosis not present

## 2014-01-29 ENCOUNTER — Ambulatory Visit (HOSPITAL_COMMUNITY)
Admission: RE | Admit: 2014-01-29 | Discharge: 2014-01-30 | Disposition: A | Discharge status: Home / Self Care | Payer: Medicare Other | Source: Ambulatory Visit | Attending: Internal Medicine | Admitting: Internal Medicine

## 2014-01-29 DIAGNOSIS — N401 Enlarged prostate with lower urinary tract symptoms: Secondary | ICD-10-CM | POA: Diagnosis not present

## 2014-01-29 DIAGNOSIS — I2510 Atherosclerotic heart disease of native coronary artery without angina pectoris: Secondary | ICD-10-CM | POA: Insufficient documentation

## 2014-01-29 DIAGNOSIS — I4891 Unspecified atrial fibrillation: Secondary | ICD-10-CM | POA: Diagnosis not present

## 2014-01-29 DIAGNOSIS — I442 Atrioventricular block, complete: Secondary | ICD-10-CM | POA: Insufficient documentation

## 2014-01-29 DIAGNOSIS — E785 Hyperlipidemia, unspecified: Secondary | ICD-10-CM | POA: Diagnosis not present

## 2014-01-29 DIAGNOSIS — Z7901 Long term (current) use of anticoagulants: Secondary | ICD-10-CM | POA: Insufficient documentation

## 2014-01-29 DIAGNOSIS — N138 Other obstructive and reflux uropathy: Secondary | ICD-10-CM | POA: Diagnosis not present

## 2014-01-29 DIAGNOSIS — I480 Paroxysmal atrial fibrillation: Secondary | ICD-10-CM | POA: Insufficient documentation

## 2014-01-29 DIAGNOSIS — N210 Calculus in bladder: Secondary | ICD-10-CM | POA: Diagnosis not present

## 2014-01-29 DIAGNOSIS — I459 Conduction disorder, unspecified: Secondary | ICD-10-CM | POA: Insufficient documentation

## 2014-01-29 DIAGNOSIS — Z01812 Encounter for preprocedural laboratory examination: Secondary | ICD-10-CM

## 2014-01-29 DIAGNOSIS — Z4541 Encounter for adjustment and management of cerebrospinal fluid drainage device: Secondary | ICD-10-CM | POA: Diagnosis not present

## 2014-01-29 DIAGNOSIS — I10 Essential (primary) hypertension: Secondary | ICD-10-CM | POA: Diagnosis not present

## 2014-01-29 DIAGNOSIS — Z45010 Encounter for checking and testing of cardiac pacemaker pulse generator [battery]: Secondary | ICD-10-CM | POA: Insufficient documentation

## 2014-01-29 DIAGNOSIS — I482 Chronic atrial fibrillation, unspecified: Secondary | ICD-10-CM

## 2014-01-29 MED ORDER — CEFAZOLIN SODIUM-DEXTROSE 2-3 GM-% IV SOLR: 2.0000 g | INTRAVENOUS | Status: DC

## 2014-01-29 MED ORDER — SODIUM CHLORIDE 0.9 % IV SOLN
INTRAVENOUS | Status: DC
  Administered 2014-01-30: 12:00:00 via INTRAVENOUS

## 2014-01-29 MED ORDER — SODIUM CHLORIDE 0.9 % IR SOLN
80.0000 mg | Status: DC
  Filled 2014-01-29: qty 2

## 2014-01-30 ENCOUNTER — Encounter (HOSPITAL_COMMUNITY)
Admission: RE | Disposition: A | Discharge status: Home / Self Care | Payer: Self-pay | Source: Ambulatory Visit | Attending: Internal Medicine

## 2014-01-30 DIAGNOSIS — Z7901 Long term (current) use of anticoagulants: Secondary | ICD-10-CM | POA: Diagnosis not present

## 2014-01-30 DIAGNOSIS — I2510 Atherosclerotic heart disease of native coronary artery without angina pectoris: Secondary | ICD-10-CM | POA: Diagnosis not present

## 2014-01-30 DIAGNOSIS — I10 Essential (primary) hypertension: Secondary | ICD-10-CM | POA: Diagnosis not present

## 2014-01-30 DIAGNOSIS — I459 Conduction disorder, unspecified: Secondary | ICD-10-CM | POA: Diagnosis not present

## 2014-01-30 DIAGNOSIS — E785 Hyperlipidemia, unspecified: Secondary | ICD-10-CM | POA: Diagnosis not present

## 2014-01-30 DIAGNOSIS — I442 Atrioventricular block, complete: Secondary | ICD-10-CM | POA: Diagnosis not present

## 2014-01-30 DIAGNOSIS — Z45010 Encounter for checking and testing of cardiac pacemaker pulse generator [battery]: Secondary | ICD-10-CM | POA: Diagnosis not present

## 2014-01-30 DIAGNOSIS — I480 Paroxysmal atrial fibrillation: Secondary | ICD-10-CM | POA: Diagnosis not present

## 2014-01-30 HISTORY — PX: PERMANENT PACEMAKER GENERATOR CHANGE: SHX6022

## 2014-01-30 LAB — PROTIME-INR
INR: 1.32 (ref 0.00–1.49)
PROTHROMBIN TIME: 16.4 s — AB (ref 11.6–15.2)

## 2014-01-30 LAB — SURGICAL PCR SCREEN
MRSA, PCR: NEGATIVE
STAPHYLOCOCCUS AUREUS: NEGATIVE

## 2014-01-30 SURGERY — PERMANENT PACEMAKER GENERATOR CHANGE
Anesthesia: LOCAL

## 2014-01-30 MED ORDER — ONDANSETRON HCL 4 MG/2ML IJ SOLN: 4.0000 mg | Freq: Four times a day (QID) | INTRAMUSCULAR | Status: DC | PRN

## 2014-01-30 MED ORDER — LIDOCAINE HCL (PF) 1 % IJ SOLN
INTRAMUSCULAR | Status: AC
  Filled 2014-01-30: qty 60

## 2014-01-30 MED ORDER — MUPIROCIN 2 % EX OINT
TOPICAL_OINTMENT | CUTANEOUS | Status: AC
  Administered 2014-01-30: 1 via TOPICAL
  Filled 2014-01-30: qty 22

## 2014-01-30 MED ORDER — ACETAMINOPHEN 325 MG PO TABS: 325.0000 mg | ORAL_TABLET | ORAL | Status: DC | PRN

## 2014-01-30 MED ORDER — MUPIROCIN 2 % EX OINT
1.0000 "application " | TOPICAL_OINTMENT | Freq: Once | CUTANEOUS | Status: AC
  Administered 2014-01-30: 1 via TOPICAL
  Filled 2014-01-30: qty 22

## 2014-01-30 NOTE — Discharge Instructions (Signed)
Pacemaker Battery Change, Care After °Refer to this sheet in the next few weeks. These instructions provide you with information on caring for yourself after your procedure. Your health care provider may also give you more specific instructions. Your treatment has been planned according to current medical practices, but problems sometimes occur. Call your health care provider if you have any problems or questions after your procedure. °WHAT TO EXPECT AFTER THE PROCEDURE °After your procedure, it is typical to have the following sensations: °· Soreness at the pacemaker site. °HOME CARE INSTRUCTIONS  °· Keep the incision clean and dry. °· Unless advised otherwise, you may shower beginning 48 hours after your procedure. °· For the first week after the replacement, avoid stretching motions that pull at the incision site, and avoid heavy exercise with the arm that is on the same side as the incision. °· Take medicines only as directed by your health care provider. °· Keep all follow-up visits as directed by your health care provider. °SEEK MEDICAL CARE IF:  °· You have pain at the incision site that is not relieved by over-the-counter or prescription medicine. °· There is drainage or pus from the incision site. °· There is swelling larger than a lime at the incision site. °· You develop red streaking that extends above or below the incision site. °· You feel brief, intermittent palpitations, light-headedness, or any symptoms that you feel might be related to your heart. °SEEK IMMEDIATE MEDICAL CARE IF:  °· You experience chest pain that is different than the pain at the pacemaker site. °· You experience shortness of breath. °· You have palpitations or irregular heartbeat. °· You have light-headedness that does not go away quickly. °· You faint. °· You have pain that gets worse and is not relieved by medicine. °Document Released: 02/06/2013 Document Revised: 09/02/2013 Document Reviewed: 02/06/2013 °Exit[employer]® Patient  Information ©2015 [employer]. This information is not intended to replace advice given to you by your health care provider. Make sure you discuss any questions you have with your health care provider. ° °

## 2014-01-30 NOTE — H&P (View-Only) (Signed)
      HPI [name] is referred today by Dr. Wynonia Lawman after a long absence from our arrhythmia clinic. He is a very pleasant 80 year old man with a history of complete heart block, status post permanent pacemaker insertion, who also has atrial fibrillation, and hypertension. He has reached elective replacement. The patient has done amazingly well and denies chest pain, shortness of breath, or syncope. No peripheral edema. He is been maintained on warfarin for thromboembolic prevention in the setting of paroxysmal atrial fibrillation. No Known Allergies   Current Outpatient Prescriptions  Medication Sig Dispense Refill  . ezetimibe-simvastatin (VYTORIN) 10-40 MG per tablet Take 1 tablet by mouth daily.      . felodipine (PLENDIL) 2.5 MG 24 hr tablet Take 2.5 mg by mouth daily.      . metoprolol succinate (TOPROL-XL) 25 MG 24 hr tablet Take 25 mg by mouth daily.      . tamsulosin (FLOMAX) 0.4 MG CAPS capsule Take 0.4 mg by mouth daily.      Marland Kitchen warfarin (COUMADIN) 3 MG tablet Take 3 mg by mouth daily.       No current facility-administered medications for this visit.     Past Medical History  Diagnosis Date  . Cardiac pacemaker in situ 01/07/2014    Pacer insertion for third degree block 06/27/03 Dr. Lovena Fox  RA lead Medtronic 5076 RV lead Medtronic 5076 serial number: [id number]   Medtronic Sigma   [id number]  . Atrial fibrillation   . CAD (coronary artery disease), native coronary artery   . Essential hypertension   . Hyperlipidemia 01/07/2014  . Long term (current) use of anticoagulants 01/07/2014  . Pernicious anemia   . Nephrolithiasis   . Bladder stones     ROS:   All systems reviewed and negative except as noted in the HPI.   Past Surgical History  Procedure Laterality Date  . Appendectomy    . Inguinal hernia repair    . Pacemaker insertion    . Nasal sinus surgery       History reviewed. No pertinent family history.   History   Social History  . Marital Status:  Married    Spouse Name: N/A    Number of Children: N/A  . Years of Education: N/A   Occupational History  . Not on file.   Social History Main Topics  . Smoking status: Never Smoker   . Smokeless tobacco: Never Used  . Alcohol Use: No  . Drug Use: No  . Sexual Activity: No   Other Topics Concern  . Not on file   Social History Narrative  . No narrative on file     BP 138/64  Pulse 60  Ht 5\' 4"  (1.626 m)  Wt 140 lb 12.8 oz (63.866 kg)  BMI 24.16 kg/m2  Physical Exam:  Well appearing elderly man, NAD HEENT: Unremarkable Neck:  No JVD, no thyromegally Back:  No CVA tenderness Lungs:  Clear with no wheezes, rales, or rhonchi. HEART:  Regular rate rhythm, no murmurs, no rubs, no clicks Abd:  soft, positive bowel sounds, no organomegally, no rebound, no guarding Ext:  2 plus pulses, no edema, no cyanosis, no clubbing Skin:  No rashes no nodules Neuro:  CN II through XII intact, motor grossly intact  EKG - normal sinus rhythm with AV sequential pacing  DEVICE  Normal device function.  See PaceArt for details. Medtronic device at elective replacement  Assess/Plan:

## 2014-01-30 NOTE — Interval H&P Note (Signed)
History and Physical Interval Note:  01/30/2014 1:38 PM  [name]  has presented today for surgery, with the diagnosis of eri  The various methods of treatment have been discussed with the patient and family. After consideration of risks, benefits and other options for treatment, the patient has consented to  Procedure(s): PERMANENT PACEMAKER GENERATOR CHANGE (N/A) as a surgical intervention .  The patient's history has been reviewed, patient examined, no change in status, stable for surgery.  I have reviewed the patient's chart and labs.  Questions were answered to the patient's satisfaction.     Mikle Bosworth.D.

## 2014-01-30 NOTE — CV Procedure (Signed)
EP Procedure Note   Procedure: Removal of a previously implanted DDD PM which had reached ERI and insertion of a new DDD PM   Preoperative Diagnosis: Complete heart block, s/p PPM insertion with the old device at [facility]   Postoperative Diagnosis: same as preop diagnosis   Description of the Procedure: After informed consent was obtained, the patient was taken to the EP lab in the fasting state. After the usual preparation and draping, 30 cc of lidocaine was infiltrated into the left infraclavicular region. A 5 cm incision was made and electrocautery utilized to dissect down to the PPM pocket. The generator was removed with gentle traction. The old leads were evaluated and found to be working satisfactorily. The Medtronic [place] DDD PM [id number]) was connected to the old atrial and ventricular leads and placed back into the subcutaneous pocket. The pocket was irrigated and the incision was closed with 2 layers of vicryl suture. Benzoin and steri-strips were painted on the skin and a bandage placed.   Complications: none immediately   Conclusion: successful removal of a DDD PM which had reached ERI in a patient with CHB, and insertion of a new DDD PM without immediate complication.   Mikle Bosworth.D.

## 2014-01-31 HISTORY — PX: PACEMAKER GENERATOR CHANGE: SHX5998

## 2014-02-05 ENCOUNTER — Telehealth: Payer: Self-pay | Admitting: Internal Medicine

## 2014-02-05 NOTE — Telephone Encounter (Signed)
Pt had generator change on 10-1 and will do remote follow ups from home with new monitor. I informed Lucita Ferrara of this and she verbalized understanding.

## 2014-02-05 NOTE — Telephone Encounter (Signed)
New Message  [place] with [employer] for remote pacer checks called . She reports that the pt says he has a new pacer and that remote checks are no longer needed. Please call back to discuss.

## 2014-02-10 ENCOUNTER — Ambulatory Visit: Payer: Medicare Other

## 2014-02-12 ENCOUNTER — Ambulatory Visit (INDEPENDENT_AMBULATORY_CARE_PROVIDER_SITE_OTHER): Payer: Medicare Other | Admitting: *Deleted

## 2014-02-12 ENCOUNTER — Encounter: Payer: Self-pay | Admitting: Internal Medicine

## 2014-02-12 DIAGNOSIS — I480 Paroxysmal atrial fibrillation: Secondary | ICD-10-CM | POA: Diagnosis not present

## 2014-02-12 LAB — MDC_IDC_ENUM_SESS_TYPE_INCLINIC
Battery Impedance: 100 Ohm
Battery Voltage: 2.8 V
Brady Statistic AP VP Percent: 98 %
Brady Statistic AP VS Percent: 0 %
Brady Statistic AS VP Percent: 1 %
Brady Statistic AS VS Percent: 0 %
Date Time Interrogation Session: 20151014185608
Lead Channel Impedance Value: 781 Ohm
Lead Channel Pacing Threshold Amplitude: 0.5 V
Lead Channel Pacing Threshold Amplitude: 1.25 V
Lead Channel Pacing Threshold Pulse Width: 0.4 ms
Lead Channel Pacing Threshold Pulse Width: 0.64 ms
Lead Channel Sensing Intrinsic Amplitude: 1.4 mV
Lead Channel Setting Pacing Amplitude: 1.625
MDC IDC MSMT BATTERY REMAINING LONGEVITY: 94 mo
MDC IDC MSMT LEADCHNL RV IMPEDANCE VALUE: 485 Ohm
MDC IDC SET LEADCHNL RV PACING AMPLITUDE: 3 V
MDC IDC SET LEADCHNL RV PACING PULSEWIDTH: 0.64 ms
MDC IDC SET LEADCHNL RV SENSING SENSITIVITY: 4 mV

## 2014-02-12 NOTE — Progress Notes (Signed)
Wound check appointment. Steri-strips removed. Wound without redness or edema. Incision edges approximated, wound well healed. Normal device function. Thresholds, sensing, and impedances consistent with implant measurements. Device programmed at 3.5V/auto capture programmed on for extra safety margin until 3 month visit. Histogram distribution appropriate for patient and level of activity. 1 mode switch, <0.1%, + coumadin.  No high ventricular rates noted. Patient educated about wound care, arm mobility, lifting restrictions. ROV in 3 months with implanting physician.

## 2014-02-24 DIAGNOSIS — Z7901 Long term (current) use of anticoagulants: Secondary | ICD-10-CM | POA: Diagnosis not present

## 2014-02-24 DIAGNOSIS — D510 Vitamin B12 deficiency anemia due to intrinsic factor deficiency: Secondary | ICD-10-CM | POA: Diagnosis not present

## 2014-03-09 ENCOUNTER — Encounter (HOSPITAL_COMMUNITY): Payer: Self-pay | Admitting: *Deleted

## 2014-03-24 DIAGNOSIS — Z7901 Long term (current) use of anticoagulants: Secondary | ICD-10-CM | POA: Diagnosis not present

## 2014-03-24 DIAGNOSIS — D510 Vitamin B12 deficiency anemia due to intrinsic factor deficiency: Secondary | ICD-10-CM | POA: Diagnosis not present

## 2014-03-31 DIAGNOSIS — Z7901 Long term (current) use of anticoagulants: Secondary | ICD-10-CM | POA: Diagnosis not present

## 2014-03-31 DIAGNOSIS — N401 Enlarged prostate with lower urinary tract symptoms: Secondary | ICD-10-CM | POA: Diagnosis not present

## 2014-03-31 DIAGNOSIS — N200 Calculus of kidney: Secondary | ICD-10-CM | POA: Diagnosis not present

## 2014-03-31 DIAGNOSIS — N210 Calculus in bladder: Secondary | ICD-10-CM | POA: Diagnosis not present

## 2014-04-10 ENCOUNTER — Encounter (HOSPITAL_COMMUNITY): Payer: Self-pay | Admitting: Internal Medicine

## 2014-05-01 DIAGNOSIS — Z7901 Long term (current) use of anticoagulants: Secondary | ICD-10-CM | POA: Diagnosis not present

## 2014-05-01 DIAGNOSIS — D510 Vitamin B12 deficiency anemia due to intrinsic factor deficiency: Secondary | ICD-10-CM | POA: Diagnosis not present

## 2014-05-06 DIAGNOSIS — Z7901 Long term (current) use of anticoagulants: Secondary | ICD-10-CM | POA: Diagnosis not present

## 2014-05-07 ENCOUNTER — Telehealth: Payer: Self-pay | Admitting: *Deleted

## 2014-05-12 DIAGNOSIS — Z7901 Long term (current) use of anticoagulants: Secondary | ICD-10-CM | POA: Diagnosis not present

## 2014-05-29 DIAGNOSIS — D510 Vitamin B12 deficiency anemia due to intrinsic factor deficiency: Secondary | ICD-10-CM | POA: Diagnosis not present

## 2014-05-29 DIAGNOSIS — Z7901 Long term (current) use of anticoagulants: Secondary | ICD-10-CM | POA: Diagnosis not present

## 2014-06-05 ENCOUNTER — Encounter: Payer: Self-pay | Admitting: *Deleted

## 2014-06-26 ENCOUNTER — Encounter: Payer: Self-pay | Admitting: *Deleted

## 2014-06-26 DIAGNOSIS — D510 Vitamin B12 deficiency anemia due to intrinsic factor deficiency: Secondary | ICD-10-CM | POA: Diagnosis not present

## 2014-06-26 DIAGNOSIS — Z7901 Long term (current) use of anticoagulants: Secondary | ICD-10-CM | POA: Diagnosis not present

## 2014-07-01 DIAGNOSIS — N210 Calculus in bladder: Secondary | ICD-10-CM | POA: Diagnosis not present

## 2014-07-01 DIAGNOSIS — N200 Calculus of kidney: Secondary | ICD-10-CM | POA: Diagnosis not present

## 2014-07-01 DIAGNOSIS — N401 Enlarged prostate with lower urinary tract symptoms: Secondary | ICD-10-CM | POA: Diagnosis not present

## 2014-07-10 DIAGNOSIS — Z7901 Long term (current) use of anticoagulants: Secondary | ICD-10-CM | POA: Diagnosis not present

## 2014-07-10 DIAGNOSIS — I480 Paroxysmal atrial fibrillation: Secondary | ICD-10-CM | POA: Diagnosis not present

## 2014-07-10 DIAGNOSIS — I2510 Atherosclerotic heart disease of native coronary artery without angina pectoris: Secondary | ICD-10-CM | POA: Diagnosis not present

## 2014-07-24 DIAGNOSIS — D510 Vitamin B12 deficiency anemia due to intrinsic factor deficiency: Secondary | ICD-10-CM | POA: Diagnosis not present

## 2014-08-21 DIAGNOSIS — D510 Vitamin B12 deficiency anemia due to intrinsic factor deficiency: Secondary | ICD-10-CM | POA: Diagnosis not present

## 2014-08-21 DIAGNOSIS — Z7901 Long term (current) use of anticoagulants: Secondary | ICD-10-CM | POA: Diagnosis not present

## 2014-08-27 DIAGNOSIS — L820 Inflamed seborrheic keratosis: Secondary | ICD-10-CM | POA: Diagnosis not present

## 2014-09-25 DIAGNOSIS — D510 Vitamin B12 deficiency anemia due to intrinsic factor deficiency: Secondary | ICD-10-CM | POA: Diagnosis not present

## 2014-09-25 DIAGNOSIS — Z7901 Long term (current) use of anticoagulants: Secondary | ICD-10-CM | POA: Diagnosis not present

## 2014-10-02 DIAGNOSIS — Z7901 Long term (current) use of anticoagulants: Secondary | ICD-10-CM | POA: Diagnosis not present

## 2014-10-30 DIAGNOSIS — D510 Vitamin B12 deficiency anemia due to intrinsic factor deficiency: Secondary | ICD-10-CM | POA: Diagnosis not present

## 2014-10-30 DIAGNOSIS — Z7901 Long term (current) use of anticoagulants: Secondary | ICD-10-CM | POA: Diagnosis not present

## 2014-10-31 DIAGNOSIS — N210 Calculus in bladder: Secondary | ICD-10-CM | POA: Diagnosis not present

## 2014-10-31 DIAGNOSIS — N401 Enlarged prostate with lower urinary tract symptoms: Secondary | ICD-10-CM | POA: Diagnosis not present

## 2014-10-31 DIAGNOSIS — N200 Calculus of kidney: Secondary | ICD-10-CM | POA: Diagnosis not present

## 2014-11-07 DIAGNOSIS — N210 Calculus in bladder: Secondary | ICD-10-CM | POA: Diagnosis not present

## 2014-11-07 DIAGNOSIS — N200 Calculus of kidney: Secondary | ICD-10-CM | POA: Diagnosis not present

## 2014-11-27 DIAGNOSIS — D510 Vitamin B12 deficiency anemia due to intrinsic factor deficiency: Secondary | ICD-10-CM | POA: Diagnosis not present

## 2014-11-27 DIAGNOSIS — Z7901 Long term (current) use of anticoagulants: Secondary | ICD-10-CM | POA: Diagnosis not present

## 2014-12-12 DIAGNOSIS — Z0000 Encounter for general adult medical examination without abnormal findings: Secondary | ICD-10-CM | POA: Diagnosis not present

## 2014-12-12 DIAGNOSIS — Z79899 Other long term (current) drug therapy: Secondary | ICD-10-CM | POA: Diagnosis not present

## 2014-12-12 DIAGNOSIS — Z9181 History of falling: Secondary | ICD-10-CM | POA: Diagnosis not present

## 2014-12-12 DIAGNOSIS — L821 Other seborrheic keratosis: Secondary | ICD-10-CM | POA: Diagnosis not present

## 2014-12-12 DIAGNOSIS — E780 Pure hypercholesterolemia: Secondary | ICD-10-CM | POA: Diagnosis not present

## 2014-12-12 DIAGNOSIS — Z1389 Encounter for screening for other disorder: Secondary | ICD-10-CM | POA: Diagnosis not present

## 2014-12-12 DIAGNOSIS — I10 Essential (primary) hypertension: Secondary | ICD-10-CM | POA: Diagnosis not present

## 2014-12-25 DIAGNOSIS — D510 Vitamin B12 deficiency anemia due to intrinsic factor deficiency: Secondary | ICD-10-CM | POA: Diagnosis not present

## 2014-12-25 DIAGNOSIS — Z7901 Long term (current) use of anticoagulants: Secondary | ICD-10-CM | POA: Diagnosis not present

## 2015-01-22 DIAGNOSIS — Z7901 Long term (current) use of anticoagulants: Secondary | ICD-10-CM | POA: Diagnosis not present

## 2015-01-22 DIAGNOSIS — D510 Vitamin B12 deficiency anemia due to intrinsic factor deficiency: Secondary | ICD-10-CM | POA: Diagnosis not present

## 2015-02-19 DIAGNOSIS — Z7901 Long term (current) use of anticoagulants: Secondary | ICD-10-CM | POA: Diagnosis not present

## 2015-02-19 DIAGNOSIS — D510 Vitamin B12 deficiency anemia due to intrinsic factor deficiency: Secondary | ICD-10-CM | POA: Diagnosis not present

## 2015-03-19 DIAGNOSIS — D510 Vitamin B12 deficiency anemia due to intrinsic factor deficiency: Secondary | ICD-10-CM | POA: Diagnosis not present

## 2015-03-19 DIAGNOSIS — Z7901 Long term (current) use of anticoagulants: Secondary | ICD-10-CM | POA: Diagnosis not present

## 2015-04-16 DIAGNOSIS — Z7901 Long term (current) use of anticoagulants: Secondary | ICD-10-CM | POA: Diagnosis not present

## 2015-04-16 DIAGNOSIS — D510 Vitamin B12 deficiency anemia due to intrinsic factor deficiency: Secondary | ICD-10-CM | POA: Diagnosis not present

## 2015-04-28 DIAGNOSIS — I10 Essential (primary) hypertension: Secondary | ICD-10-CM | POA: Diagnosis not present

## 2015-04-28 DIAGNOSIS — I359 Nonrheumatic aortic valve disorder, unspecified: Secondary | ICD-10-CM | POA: Diagnosis not present

## 2015-04-28 DIAGNOSIS — Z7901 Long term (current) use of anticoagulants: Secondary | ICD-10-CM | POA: Diagnosis not present

## 2015-04-28 DIAGNOSIS — Z950 Presence of cardiac pacemaker: Secondary | ICD-10-CM | POA: Diagnosis not present

## 2015-04-28 DIAGNOSIS — I2510 Atherosclerotic heart disease of native coronary artery without angina pectoris: Secondary | ICD-10-CM | POA: Diagnosis not present

## 2015-04-28 DIAGNOSIS — I480 Paroxysmal atrial fibrillation: Secondary | ICD-10-CM | POA: Diagnosis not present

## 2015-04-28 DIAGNOSIS — E785 Hyperlipidemia, unspecified: Secondary | ICD-10-CM | POA: Diagnosis not present

## 2015-05-15 DIAGNOSIS — J329 Chronic sinusitis, unspecified: Secondary | ICD-10-CM | POA: Diagnosis not present

## 2015-05-28 DIAGNOSIS — Z7901 Long term (current) use of anticoagulants: Secondary | ICD-10-CM | POA: Diagnosis not present

## 2015-05-28 DIAGNOSIS — D510 Vitamin B12 deficiency anemia due to intrinsic factor deficiency: Secondary | ICD-10-CM | POA: Diagnosis not present

## 2015-06-01 DIAGNOSIS — N210 Calculus in bladder: Secondary | ICD-10-CM | POA: Diagnosis not present

## 2015-06-01 DIAGNOSIS — N401 Enlarged prostate with lower urinary tract symptoms: Secondary | ICD-10-CM | POA: Diagnosis not present

## 2015-06-01 DIAGNOSIS — Z125 Encounter for screening for malignant neoplasm of prostate: Secondary | ICD-10-CM | POA: Diagnosis not present

## 2015-06-04 DIAGNOSIS — N210 Calculus in bladder: Secondary | ICD-10-CM | POA: Diagnosis not present

## 2015-06-04 DIAGNOSIS — N200 Calculus of kidney: Secondary | ICD-10-CM | POA: Diagnosis not present

## 2015-06-25 DIAGNOSIS — M25561 Pain in right knee: Secondary | ICD-10-CM | POA: Diagnosis not present

## 2015-06-25 DIAGNOSIS — Z7901 Long term (current) use of anticoagulants: Secondary | ICD-10-CM | POA: Diagnosis not present

## 2015-06-25 DIAGNOSIS — M25562 Pain in left knee: Secondary | ICD-10-CM | POA: Diagnosis not present

## 2015-06-25 DIAGNOSIS — M810 Age-related osteoporosis without current pathological fracture: Secondary | ICD-10-CM | POA: Diagnosis not present

## 2015-06-25 DIAGNOSIS — I4891 Unspecified atrial fibrillation: Secondary | ICD-10-CM | POA: Diagnosis not present

## 2015-06-25 DIAGNOSIS — I10 Essential (primary) hypertension: Secondary | ICD-10-CM | POA: Diagnosis not present

## 2015-07-16 DIAGNOSIS — E785 Hyperlipidemia, unspecified: Secondary | ICD-10-CM | POA: Diagnosis not present

## 2015-07-16 DIAGNOSIS — Z950 Presence of cardiac pacemaker: Secondary | ICD-10-CM | POA: Diagnosis not present

## 2015-07-16 DIAGNOSIS — I359 Nonrheumatic aortic valve disorder, unspecified: Secondary | ICD-10-CM | POA: Diagnosis not present

## 2015-07-16 DIAGNOSIS — I480 Paroxysmal atrial fibrillation: Secondary | ICD-10-CM | POA: Diagnosis not present

## 2015-07-16 DIAGNOSIS — I10 Essential (primary) hypertension: Secondary | ICD-10-CM | POA: Diagnosis not present

## 2015-07-16 DIAGNOSIS — I2510 Atherosclerotic heart disease of native coronary artery without angina pectoris: Secondary | ICD-10-CM | POA: Diagnosis not present

## 2015-07-16 DIAGNOSIS — Z7901 Long term (current) use of anticoagulants: Secondary | ICD-10-CM | POA: Diagnosis not present

## 2015-07-28 DIAGNOSIS — Z950 Presence of cardiac pacemaker: Secondary | ICD-10-CM | POA: Diagnosis not present

## 2015-07-28 DIAGNOSIS — I10 Essential (primary) hypertension: Secondary | ICD-10-CM | POA: Diagnosis not present

## 2015-07-28 DIAGNOSIS — I2510 Atherosclerotic heart disease of native coronary artery without angina pectoris: Secondary | ICD-10-CM | POA: Diagnosis not present

## 2015-07-28 DIAGNOSIS — Z7901 Long term (current) use of anticoagulants: Secondary | ICD-10-CM | POA: Diagnosis not present

## 2015-07-28 DIAGNOSIS — I480 Paroxysmal atrial fibrillation: Secondary | ICD-10-CM | POA: Diagnosis not present

## 2015-07-28 DIAGNOSIS — E785 Hyperlipidemia, unspecified: Secondary | ICD-10-CM | POA: Diagnosis not present

## 2015-07-28 DIAGNOSIS — I359 Nonrheumatic aortic valve disorder, unspecified: Secondary | ICD-10-CM | POA: Diagnosis not present

## 2015-07-29 DIAGNOSIS — D510 Vitamin B12 deficiency anemia due to intrinsic factor deficiency: Secondary | ICD-10-CM | POA: Diagnosis not present

## 2015-08-26 DIAGNOSIS — D510 Vitamin B12 deficiency anemia due to intrinsic factor deficiency: Secondary | ICD-10-CM | POA: Diagnosis not present

## 2015-08-26 DIAGNOSIS — Z7901 Long term (current) use of anticoagulants: Secondary | ICD-10-CM | POA: Diagnosis not present

## 2015-08-27 DIAGNOSIS — K5900 Constipation, unspecified: Secondary | ICD-10-CM | POA: Diagnosis not present

## 2015-09-02 DIAGNOSIS — Z79899 Other long term (current) drug therapy: Secondary | ICD-10-CM | POA: Diagnosis not present

## 2015-09-02 DIAGNOSIS — N202 Calculus of kidney with calculus of ureter: Secondary | ICD-10-CM | POA: Diagnosis not present

## 2015-09-02 DIAGNOSIS — Z7901 Long term (current) use of anticoagulants: Secondary | ICD-10-CM | POA: Diagnosis not present

## 2015-09-02 DIAGNOSIS — I4891 Unspecified atrial fibrillation: Secondary | ICD-10-CM | POA: Diagnosis not present

## 2015-09-02 DIAGNOSIS — D519 Vitamin B12 deficiency anemia, unspecified: Secondary | ICD-10-CM | POA: Diagnosis not present

## 2015-09-02 DIAGNOSIS — N132 Hydronephrosis with renal and ureteral calculous obstruction: Secondary | ICD-10-CM | POA: Diagnosis not present

## 2015-09-02 DIAGNOSIS — N201 Calculus of ureter: Secondary | ICD-10-CM | POA: Diagnosis not present

## 2015-09-02 DIAGNOSIS — N200 Calculus of kidney: Secondary | ICD-10-CM | POA: Diagnosis not present

## 2015-09-02 DIAGNOSIS — Z950 Presence of cardiac pacemaker: Secondary | ICD-10-CM | POA: Diagnosis not present

## 2015-09-02 DIAGNOSIS — I10 Essential (primary) hypertension: Secondary | ICD-10-CM | POA: Diagnosis not present

## 2015-09-03 DIAGNOSIS — N201 Calculus of ureter: Secondary | ICD-10-CM | POA: Diagnosis not present

## 2015-09-03 DIAGNOSIS — R1032 Left lower quadrant pain: Secondary | ICD-10-CM | POA: Diagnosis not present

## 2015-09-03 DIAGNOSIS — N202 Calculus of kidney with calculus of ureter: Secondary | ICD-10-CM | POA: Diagnosis not present

## 2015-09-03 DIAGNOSIS — N200 Calculus of kidney: Secondary | ICD-10-CM | POA: Diagnosis not present

## 2015-09-04 DIAGNOSIS — N201 Calculus of ureter: Secondary | ICD-10-CM | POA: Diagnosis not present

## 2015-09-04 DIAGNOSIS — R1032 Left lower quadrant pain: Secondary | ICD-10-CM | POA: Diagnosis not present

## 2015-09-07 DIAGNOSIS — N200 Calculus of kidney: Secondary | ICD-10-CM | POA: Diagnosis not present

## 2015-09-07 DIAGNOSIS — R1032 Left lower quadrant pain: Secondary | ICD-10-CM | POA: Diagnosis not present

## 2015-09-08 DIAGNOSIS — Z950 Presence of cardiac pacemaker: Secondary | ICD-10-CM | POA: Diagnosis not present

## 2015-09-08 DIAGNOSIS — I10 Essential (primary) hypertension: Secondary | ICD-10-CM | POA: Diagnosis not present

## 2015-09-08 DIAGNOSIS — Z79899 Other long term (current) drug therapy: Secondary | ICD-10-CM | POA: Diagnosis not present

## 2015-09-08 DIAGNOSIS — K219 Gastro-esophageal reflux disease without esophagitis: Secondary | ICD-10-CM | POA: Diagnosis not present

## 2015-09-08 DIAGNOSIS — N210 Calculus in bladder: Secondary | ICD-10-CM | POA: Diagnosis not present

## 2015-09-08 DIAGNOSIS — N201 Calculus of ureter: Secondary | ICD-10-CM | POA: Diagnosis not present

## 2015-09-08 DIAGNOSIS — Z7901 Long term (current) use of anticoagulants: Secondary | ICD-10-CM | POA: Diagnosis not present

## 2015-09-08 DIAGNOSIS — Z87891 Personal history of nicotine dependence: Secondary | ICD-10-CM | POA: Diagnosis not present

## 2015-09-08 DIAGNOSIS — K8020 Calculus of gallbladder without cholecystitis without obstruction: Secondary | ICD-10-CM | POA: Diagnosis not present

## 2015-09-08 DIAGNOSIS — N401 Enlarged prostate with lower urinary tract symptoms: Secondary | ICD-10-CM | POA: Diagnosis not present

## 2015-09-09 DIAGNOSIS — N210 Calculus in bladder: Secondary | ICD-10-CM | POA: Diagnosis not present

## 2015-09-09 DIAGNOSIS — N201 Calculus of ureter: Secondary | ICD-10-CM | POA: Diagnosis not present

## 2015-09-09 DIAGNOSIS — Z7901 Long term (current) use of anticoagulants: Secondary | ICD-10-CM | POA: Diagnosis not present

## 2015-09-09 DIAGNOSIS — Z466 Encounter for fitting and adjustment of urinary device: Secondary | ICD-10-CM | POA: Diagnosis not present

## 2015-09-09 DIAGNOSIS — I10 Essential (primary) hypertension: Secondary | ICD-10-CM | POA: Diagnosis not present

## 2015-09-09 DIAGNOSIS — Z79899 Other long term (current) drug therapy: Secondary | ICD-10-CM | POA: Diagnosis not present

## 2015-09-09 DIAGNOSIS — N401 Enlarged prostate with lower urinary tract symptoms: Secondary | ICD-10-CM | POA: Diagnosis not present

## 2015-09-11 DIAGNOSIS — K5900 Constipation, unspecified: Secondary | ICD-10-CM | POA: Diagnosis not present

## 2015-09-11 DIAGNOSIS — N200 Calculus of kidney: Secondary | ICD-10-CM | POA: Diagnosis not present

## 2015-09-11 DIAGNOSIS — I10 Essential (primary) hypertension: Secondary | ICD-10-CM | POA: Diagnosis not present

## 2015-09-15 DIAGNOSIS — N210 Calculus in bladder: Secondary | ICD-10-CM | POA: Diagnosis not present

## 2015-09-15 DIAGNOSIS — N200 Calculus of kidney: Secondary | ICD-10-CM | POA: Diagnosis not present

## 2015-09-15 DIAGNOSIS — N401 Enlarged prostate with lower urinary tract symptoms: Secondary | ICD-10-CM | POA: Diagnosis not present

## 2015-09-24 DIAGNOSIS — Z7901 Long term (current) use of anticoagulants: Secondary | ICD-10-CM | POA: Diagnosis not present

## 2015-09-24 DIAGNOSIS — D510 Vitamin B12 deficiency anemia due to intrinsic factor deficiency: Secondary | ICD-10-CM | POA: Diagnosis not present

## 2015-10-22 DIAGNOSIS — D510 Vitamin B12 deficiency anemia due to intrinsic factor deficiency: Secondary | ICD-10-CM | POA: Diagnosis not present

## 2015-10-22 DIAGNOSIS — Z7901 Long term (current) use of anticoagulants: Secondary | ICD-10-CM | POA: Diagnosis not present

## 2015-10-27 DIAGNOSIS — N210 Calculus in bladder: Secondary | ICD-10-CM | POA: Diagnosis not present

## 2015-10-27 DIAGNOSIS — N200 Calculus of kidney: Secondary | ICD-10-CM | POA: Diagnosis not present

## 2015-10-27 DIAGNOSIS — N401 Enlarged prostate with lower urinary tract symptoms: Secondary | ICD-10-CM | POA: Diagnosis not present

## 2015-10-28 DIAGNOSIS — E785 Hyperlipidemia, unspecified: Secondary | ICD-10-CM | POA: Diagnosis not present

## 2015-10-28 DIAGNOSIS — Z7901 Long term (current) use of anticoagulants: Secondary | ICD-10-CM | POA: Diagnosis not present

## 2015-10-28 DIAGNOSIS — Z950 Presence of cardiac pacemaker: Secondary | ICD-10-CM | POA: Diagnosis not present

## 2015-10-28 DIAGNOSIS — I480 Paroxysmal atrial fibrillation: Secondary | ICD-10-CM | POA: Diagnosis not present

## 2015-10-28 DIAGNOSIS — I359 Nonrheumatic aortic valve disorder, unspecified: Secondary | ICD-10-CM | POA: Diagnosis not present

## 2015-10-28 DIAGNOSIS — I2510 Atherosclerotic heart disease of native coronary artery without angina pectoris: Secondary | ICD-10-CM | POA: Diagnosis not present

## 2015-10-28 DIAGNOSIS — I10 Essential (primary) hypertension: Secondary | ICD-10-CM | POA: Diagnosis not present

## 2015-11-19 DIAGNOSIS — D510 Vitamin B12 deficiency anemia due to intrinsic factor deficiency: Secondary | ICD-10-CM | POA: Diagnosis not present

## 2015-11-19 DIAGNOSIS — Z7901 Long term (current) use of anticoagulants: Secondary | ICD-10-CM | POA: Diagnosis not present

## 2015-12-17 DIAGNOSIS — Z7901 Long term (current) use of anticoagulants: Secondary | ICD-10-CM | POA: Diagnosis not present

## 2015-12-17 DIAGNOSIS — D510 Vitamin B12 deficiency anemia due to intrinsic factor deficiency: Secondary | ICD-10-CM | POA: Diagnosis not present

## 2016-01-18 DIAGNOSIS — D510 Vitamin B12 deficiency anemia due to intrinsic factor deficiency: Secondary | ICD-10-CM | POA: Diagnosis not present

## 2016-01-18 DIAGNOSIS — Z7901 Long term (current) use of anticoagulants: Secondary | ICD-10-CM | POA: Diagnosis not present

## 2016-01-27 DIAGNOSIS — N401 Enlarged prostate with lower urinary tract symptoms: Secondary | ICD-10-CM | POA: Diagnosis not present

## 2016-01-27 DIAGNOSIS — N200 Calculus of kidney: Secondary | ICD-10-CM | POA: Diagnosis not present

## 2016-01-27 DIAGNOSIS — N210 Calculus in bladder: Secondary | ICD-10-CM | POA: Diagnosis not present

## 2016-01-28 DIAGNOSIS — I480 Paroxysmal atrial fibrillation: Secondary | ICD-10-CM | POA: Diagnosis not present

## 2016-01-28 DIAGNOSIS — E785 Hyperlipidemia, unspecified: Secondary | ICD-10-CM | POA: Diagnosis not present

## 2016-01-28 DIAGNOSIS — Z7901 Long term (current) use of anticoagulants: Secondary | ICD-10-CM | POA: Diagnosis not present

## 2016-01-28 DIAGNOSIS — I10 Essential (primary) hypertension: Secondary | ICD-10-CM | POA: Diagnosis not present

## 2016-01-28 DIAGNOSIS — I359 Nonrheumatic aortic valve disorder, unspecified: Secondary | ICD-10-CM | POA: Diagnosis not present

## 2016-01-28 DIAGNOSIS — Z950 Presence of cardiac pacemaker: Secondary | ICD-10-CM | POA: Diagnosis not present

## 2016-01-28 DIAGNOSIS — I2510 Atherosclerotic heart disease of native coronary artery without angina pectoris: Secondary | ICD-10-CM | POA: Diagnosis not present

## 2016-02-15 ENCOUNTER — Inpatient Hospital Stay (HOSPITAL_COMMUNITY)
Admission: AD | Admit: 2016-02-15 | Discharge: 2016-02-18 | DRG: 378 | Disposition: A | Discharge status: Home Health | Payer: Medicare Other | Source: Other Acute Inpatient Hospital | Attending: Internal Medicine | Admitting: Internal Medicine

## 2016-02-15 ENCOUNTER — Encounter (HOSPITAL_COMMUNITY): Payer: Self-pay | Admitting: *Deleted

## 2016-02-15 DIAGNOSIS — Z87442 Personal history of urinary calculi: Secondary | ICD-10-CM

## 2016-02-15 DIAGNOSIS — K922 Gastrointestinal hemorrhage, unspecified: Secondary | ICD-10-CM | POA: Diagnosis not present

## 2016-02-15 DIAGNOSIS — E876 Hypokalemia: Secondary | ICD-10-CM | POA: Diagnosis present

## 2016-02-15 DIAGNOSIS — I959 Hypotension, unspecified: Secondary | ICD-10-CM | POA: Diagnosis present

## 2016-02-15 DIAGNOSIS — Z950 Presence of cardiac pacemaker: Secondary | ICD-10-CM | POA: Diagnosis present

## 2016-02-15 DIAGNOSIS — I10 Essential (primary) hypertension: Secondary | ICD-10-CM | POA: Diagnosis not present

## 2016-02-15 DIAGNOSIS — K5791 Diverticulosis of intestine, part unspecified, without perforation or abscess with bleeding: Principal | ICD-10-CM | POA: Diagnosis present

## 2016-02-15 DIAGNOSIS — D649 Anemia, unspecified: Secondary | ICD-10-CM

## 2016-02-15 DIAGNOSIS — I480 Paroxysmal atrial fibrillation: Secondary | ICD-10-CM | POA: Diagnosis not present

## 2016-02-15 DIAGNOSIS — A419 Sepsis, unspecified organism: Secondary | ICD-10-CM | POA: Diagnosis not present

## 2016-02-15 DIAGNOSIS — R404 Transient alteration of awareness: Secondary | ICD-10-CM | POA: Diagnosis not present

## 2016-02-15 DIAGNOSIS — Z87891 Personal history of nicotine dependence: Secondary | ICD-10-CM | POA: Diagnosis not present

## 2016-02-15 DIAGNOSIS — I442 Atrioventricular block, complete: Secondary | ICD-10-CM | POA: Diagnosis present

## 2016-02-15 DIAGNOSIS — D62 Acute posthemorrhagic anemia: Secondary | ICD-10-CM | POA: Diagnosis present

## 2016-02-15 DIAGNOSIS — Z7901 Long term (current) use of anticoagulants: Secondary | ICD-10-CM

## 2016-02-15 DIAGNOSIS — E785 Hyperlipidemia, unspecified: Secondary | ICD-10-CM | POA: Diagnosis present

## 2016-02-15 DIAGNOSIS — D6832 Hemorrhagic disorder due to extrinsic circulating anticoagulants: Secondary | ICD-10-CM | POA: Diagnosis present

## 2016-02-15 DIAGNOSIS — Z6820 Body mass index (BMI) 20.0-20.9, adult: Secondary | ICD-10-CM | POA: Diagnosis not present

## 2016-02-15 DIAGNOSIS — I2510 Atherosclerotic heart disease of native coronary artery without angina pectoris: Secondary | ICD-10-CM | POA: Diagnosis present

## 2016-02-15 DIAGNOSIS — R627 Adult failure to thrive: Secondary | ICD-10-CM | POA: Diagnosis present

## 2016-02-15 DIAGNOSIS — D510 Vitamin B12 deficiency anemia due to intrinsic factor deficiency: Secondary | ICD-10-CM | POA: Diagnosis present

## 2016-02-15 DIAGNOSIS — K921 Melena: Secondary | ICD-10-CM | POA: Diagnosis not present

## 2016-02-15 DIAGNOSIS — R42 Dizziness and giddiness: Secondary | ICD-10-CM | POA: Diagnosis not present

## 2016-02-15 LAB — ABO/RH: ABO/RH(D): A NEG

## 2016-02-15 LAB — PROTIME-INR
INR: 2.04
PROTHROMBIN TIME: 23.4 s — AB (ref 11.4–15.2)

## 2016-02-15 LAB — CBC
HCT: 33.5 % — ABNORMAL LOW (ref 39.0–52.0)
Hemoglobin: 11.2 g/dL — ABNORMAL LOW (ref 13.0–17.0)
MCH: 29.6 pg (ref 26.0–34.0)
MCHC: 33.4 g/dL (ref 30.0–36.0)
MCV: 88.4 fL (ref 78.0–100.0)
Platelets: 170 10*3/uL (ref 150–400)
RBC: 3.79 MIL/uL — ABNORMAL LOW (ref 4.22–5.81)
RDW: 14.2 % (ref 11.5–15.5)
WBC: 9.4 10*3/uL (ref 4.0–10.5)

## 2016-02-15 LAB — MRSA PCR SCREENING: MRSA by PCR: NEGATIVE

## 2016-02-15 LAB — PREPARE RBC (CROSSMATCH)

## 2016-02-15 MED ORDER — SODIUM CHLORIDE 0.9 % IV SOLN: Freq: Once | INTRAVENOUS | Status: DC

## 2016-02-15 MED ORDER — SODIUM CHLORIDE 0.9% FLUSH
3.0000 mL | Freq: Two times a day (BID) | INTRAVENOUS | Status: DC
  Administered 2016-02-16: 10 mL via INTRAVENOUS
  Administered 2016-02-16 – 2016-02-18 (×4): 3 mL via INTRAVENOUS

## 2016-02-15 MED ORDER — FUROSEMIDE 10 MG/ML IJ SOLN
20.0000 mg | Freq: Once | INTRAMUSCULAR | Status: AC
  Administered 2016-02-15: 20 mg via INTRAVENOUS
  Filled 2016-02-15: qty 2

## 2016-02-15 MED ORDER — ACETAMINOPHEN 650 MG RE SUPP: 650.0000 mg | Freq: Four times a day (QID) | RECTAL | Status: DC | PRN

## 2016-02-15 MED ORDER — SODIUM CHLORIDE 0.9 % IV BOLUS (SEPSIS)
250.0000 mL | Freq: Once | INTRAVENOUS | Status: AC
  Administered 2016-02-15: 250 mL via INTRAVENOUS

## 2016-02-15 MED ORDER — ACETAMINOPHEN 325 MG PO TABS: 650.0000 mg | ORAL_TABLET | Freq: Four times a day (QID) | ORAL | Status: DC | PRN

## 2016-02-15 MED ORDER — VITAMIN K1 10 MG/ML IJ SOLN
10.0000 mg | Freq: Once | INTRAVENOUS | Status: AC
  Administered 2016-02-15: 10 mg via INTRAVENOUS
  Filled 2016-02-15: qty 1

## 2016-02-15 MED ORDER — SODIUM CHLORIDE 0.9 % IV SOLN
INTRAVENOUS | Status: DC
  Administered 2016-02-15 – 2016-02-18 (×3): via INTRAVENOUS

## 2016-02-15 NOTE — H&P (Signed)
TRH H&P   Patient Demographics:    [name], is a 82 y.o. male  MRN: [id number]   DOB - 1916/11/17  Admit Date - 02/15/2016  Outpatient Primary MD for the patient is [facility] Angelique Blonder., MD  Referring MD/NP/PA: From [facility]  Outpatient Specialists: cardiology Dr David Stall  Patient coming from: Home  No chief complaint on file.     HPI:    [name]  is a 82 y.o. male, With past medical history of hypertension, CAD, atrial fibrillation on A. fib, permanent pacemaker, patient presents to the ED with bright red blood per rectum developed this morning, he denies any history of GI bleed in the past, denies any endoscopy or colonoscopy in the past as well, currently after admission, had 2 episodes of large bright red blood per rectum, report some lightheadedness upon sitting on the commode, denies any chest pain or shortness of breath, baseline hemoglobin is 13.7 on presentation at [facility], it dropped to 12.7 there, and currently is 11.2, INR was 2.2, received 2.5 mg subcutaneous vitamin K, repeat INR is 2.05 here, C8 10 mg of IV vitamin K here. Patient denies any coffee-ground emesis, nausea or vomiting, no abdominal pain, no fever or chills.    Review of systems:    In addition to the HPI above, No Fever-chills, No Headache, No changes with Vision or hearing, No problems swallowing food or Liquids, No Chest pain, Cough or Shortness of Breath, No Abdominal pain, No Nausea or Vommitting, Bowel movements are regular, Reports bright blood per rectum, denies any blood in urine No dysuria, No new skin rashes or bruises, No new joints pains-aches,  No new weakness, tingling, numbness in any extremity, for generalized weakness, and lightheadedness upon sitting on commode No recent weight gain or loss, No polyuria, polydypsia or polyphagia, No significant  Mental Stressors.  A full 10 point Review of Systems was done, except as stated above, all other Review of Systems were negative.   With Past History of the following :    Past Medical History:  Diagnosis Date  . Atrial fibrillation ([place])   . Bladder stones   . CAD (coronary artery disease), native coronary artery   . Cardiac pacemaker in situ 01/07/2014   Pacer insertion for third degree block 06/27/03 Dr. Lovena Le  RA lead Medtronic 5076 RV lead Medtronic 5076 serial number: [id number]   Medtronic Sigma   [id number]  . Essential hypertension   . Hyperlipidemia 01/07/2014  . Long term (current) use of anticoagulants 01/07/2014  . Nephrolithiasis   . Pernicious anemia       Past Surgical History:  Procedure Laterality Date  . APPENDECTOMY    . INGUINAL HERNIA REPAIR    . NASAL SINUS SURGERY    . PACEMAKER GENERATOR CHANGE  01/31/2014   MDT Sherril Croon dual chamber pacemaker change by Dr Lovena Le  . PACEMAKER INSERTION    .  PERMANENT PACEMAKER GENERATOR CHANGE N/A 01/30/2014   Procedure: PERMANENT PACEMAKER GENERATOR CHANGE;  Surgeon: Evans Lance, MD;  Location: [facility] CATH LAB;  Service: Cardiovascular;  Laterality: N/A;      Social History:     Social History  Substance Use Topics  . Smoking status: Former Smoker    Types: Pipe, Cigars    Quit date: 02/14/1974  . Smokeless tobacco: Never Used  . Alcohol use No     Lives -  At home with son  Mobility - with assistance     Family History :    History reviewed. No pertinent family history.    Home Medications:   Prior to Admission medications   Medication Sig Start Date End Date Taking? Authorizing Provider  cyanocobalamin (,VITAMIN B-12,) 1000 MCG/ML injection Inject 1,000 mcg into the muscle every 30 (thirty) days.    Yes Historical Provider, MD  felodipine (PLENDIL) 2.5 MG 24 hr tablet Take 2.5 mg by mouth every morning.  01/12/14  Yes Historical Provider, MD  metoprolol succinate (TOPROL-XL) 25 MG 24 hr tablet Take 25 mg  by mouth every morning.  11/30/13  Yes Historical Provider, MD  warfarin (COUMADIN) 3 MG tablet Take 3 mg by mouth every morning.  11/07/13  Yes Historical Provider, MD     Allergies:    No Known Allergies   Physical Exam:   Vitals  Blood pressure (!) 146/77, pulse 87, temperature 97.8 F (36.6 C), temperature source Oral, resp. rate 20, height 5\' 6"  (1.676 m), weight 54 kg (119 lb), SpO2 100 %.   1. General Frail elderly male  lying in bed in NAD,    2. Normal affect and insight, Not Suicidal or Homicidal, Awake Alert, Oriented X 3.  3. No F.N deficits, ALL C.Nerves Intact, Strength 5/5 all 4 extremities, Sensation intact all 4 extremities, Plantars down going.  4. Ears and Eyes appear Normal, Conjunctivae clear, PERRLA. Moist Oral Mucosa.  5. Supple Neck, No JVD, No cervical lymphadenopathy appriciated, No Carotid Bruits.  6. Symmetrical Chest wall movement, Good air movement bilaterally, CTAB.  7. RRR, No Gallops, Rubs or Murmurs, No Parasternal Heave.  8. Positive Bowel Sounds, Abdomen Soft, No tenderness, No organomegaly appriciated,No rebound -guarding or rigidity.  9.  No Cyanosis, Normal Skin Turgor, No Skin Rash or Bruise.  10. Good muscle tone,  joints appear normal , no effusions, Normal ROM.  11. No Palpable Lymph Nodes in Neck or Axillae    Data Review:    CBC  Recent Labs Lab 02/15/16 1518  WBC 9.4  HGB 11.2*  HCT 33.5*  PLT 170  MCV 88.4  MCH 29.6  MCHC 33.4  RDW 14.2   ------------------------------------------------------------------------------------------------------------------  Chemistries  No results for input(s): NA, K, CL, CO2, GLUCOSE, BUN, CREATININE, CALCIUM, MG, AST, ALT, ALKPHOS, BILITOT in the last 168 hours.  Invalid input(s): GFRCGP ------------------------------------------------------------------------------------------------------------------ CrCl cannot be calculated (Patient's most recent lab result is older than the  maximum 21 days allowed.). ------------------------------------------------------------------------------------------------------------------ No results for input(s): TSH, T4TOTAL, T3FREE, THYROIDAB in the last 72 hours.  Invalid input(s): FREET3  Coagulation profile  Recent Labs Lab 02/15/16 1518  INR 2.04   ------------------------------------------------------------------------------------------------------------------- No results for input(s): DDIMER in the last 72 hours. -------------------------------------------------------------------------------------------------------------------  Cardiac Enzymes No results for input(s): CKMB, TROPONINI, MYOGLOBIN in the last 168 hours.  Invalid input(s): CK ------------------------------------------------------------------------------------------------------------------ No results found for: BNP   ---------------------------------------------------------------------------------------------------------------  Urinalysis No results found for: COLORURINE, APPEARANCEUR, LABSPEC, [place], GLUCOSEU, HGBUR, BILIRUBINUR, KETONESUR, PROTEINUR, UROBILINOGEN, NITRITE,  LEUKOCYTESUR  ----------------------------------------------------------------------------------------------------------------   Imaging Results:    No results found.   Assessment & Plan:    Active Problems:   Cardiac pacemaker in situ   CAD (coronary artery disease), native coronary artery   Paroxysmal atrial fibrillation ([place])   Long term current use of anticoagulant therapy   Essential hypertension   GI bleed   Symptomatic anemia  Lower GI bleed - Patient with no history of colonoscopy in the past, this is most likely diverticular bleed in the setting of anticoagulation with INR of 2.2 - Hemoglobin 13.7 this a.m., currently 11.2, oriented to large posterior blood per rectum since admission to medical floor, will transfer to stepdown given lightheadedness upon sitting  on the commode, initially soft blood pressure on admission but improved after blood and fluids, will receive 2 units PRBC, then we'll check H&H, will keep nothing by mouth, on IV fluids, will give vitamin K and fresh frozen plasma, if another episode of bleed, will obtain bleeding scan. - Scuffed with GI Dr. Michail Sermon he will see in a.m.  Coagulopathy in the setting of anticoagulation - INR 2.05, will give 10 mg of IV vitamin K and 4 units of FFP  Paroxysmal atrial fibrillation - Currently heart rate controlled, continue to hold warfarin for GI bleed  Hypertension - Currently blood pressure acceptable, initially soft, continue to hold all medications  History of CAD - Denies any chest pain or shortness of breath  Status post pacemaker   DVT Prophylaxis SCDs   AM Labs Ordered, also please review Full Orders  Family Communication: Admission, patients condition and plan of care including tests being ordered have been discussed with the patient and son who indicate understanding and agree with the plan and Code Status.  Code Status Full Code  Likely DC to  : pending further work up, will transfer to stepdown  Condition GUARDED    Consults called: GI Dr Michail Sermon  Admission status: inpatient  Time spent in minutes : 70 minutes   Hadassa Cermak M.D on 02/15/2016 at 6:01 PM  Between 7am to 7pm - Pager - [phone]. After 7pm go to [email or web address] - password [email or web address]  [facility] - Office  [phone]

## 2016-02-15 NOTE — Progress Notes (Signed)
Pt had 2nd bloody BM. Blood currently infusing. MD made aware. Duane Trias A

## 2016-02-16 DIAGNOSIS — K5791 Diverticulosis of intestine, part unspecified, without perforation or abscess with bleeding: Principal | ICD-10-CM

## 2016-02-16 DIAGNOSIS — Z950 Presence of cardiac pacemaker: Secondary | ICD-10-CM

## 2016-02-16 DIAGNOSIS — I10 Essential (primary) hypertension: Secondary | ICD-10-CM

## 2016-02-16 LAB — COMPREHENSIVE METABOLIC PANEL
ALBUMIN: 3.3 g/dL — AB (ref 3.5–5.0)
ALK PHOS: 55 U/L (ref 38–126)
ALT: 11 U/L — ABNORMAL LOW (ref 17–63)
ANION GAP: 6 (ref 5–15)
AST: 20 U/L (ref 15–41)
BILIRUBIN TOTAL: 2.6 mg/dL — AB (ref 0.3–1.2)
BUN: 21 mg/dL — ABNORMAL HIGH (ref 6–20)
CO2: 26 mmol/L (ref 22–32)
CREATININE: 0.76 mg/dL (ref 0.61–1.24)
Calcium: 9.3 mg/dL (ref 8.9–10.3)
Chloride: 111 mmol/L (ref 101–111)
GFR calc non Af Amer: >60 mL/min (ref >60)
Glucose, Bld: 93 mg/dL (ref 65–99)
POTASSIUM: 3.7 mmol/L (ref 3.5–5.1)
Sodium: 143 mmol/L (ref 135–145)
Total Protein: 5.9 g/dL — ABNORMAL LOW (ref 6.5–8.1)

## 2016-02-16 LAB — TYPE AND SCREEN
ABO/RH(D): A NEG
Antibody Screen: NEGATIVE
UNIT DIVISION: 0
Unit division: 0

## 2016-02-16 LAB — CBC WITH DIFFERENTIAL/PLATELET
Basophils Absolute: 0 10*3/uL (ref 0.0–0.1)
Basophils Relative: 0 %
Eosinophils Absolute: 0.2 10*3/uL (ref 0.0–0.7)
Eosinophils Relative: 2 %
HCT: 32 % — ABNORMAL LOW (ref 39.0–52.0)
Hemoglobin: 11.1 g/dL — ABNORMAL LOW (ref 13.0–17.0)
Lymphocytes Relative: 32 %
Lymphs Abs: 2.4 10*3/uL (ref 0.7–4.0)
MCH: 30.3 pg (ref 26.0–34.0)
MCHC: 34.7 g/dL (ref 30.0–36.0)
MCV: 87.4 fL (ref 78.0–100.0)
Monocytes Absolute: 0.8 10*3/uL (ref 0.1–1.0)
Monocytes Relative: 11 %
Neutro Abs: 4.1 10*3/uL (ref 1.7–7.7)
Neutrophils Relative %: 55 %
Platelets: 124 10*3/uL — ABNORMAL LOW (ref 150–400)
RBC: 3.66 MIL/uL — ABNORMAL LOW (ref 4.22–5.81)
RDW: 14 % (ref 11.5–15.5)
WBC: 7.5 10*3/uL (ref 4.0–10.5)

## 2016-02-16 LAB — PROTIME-INR
INR: 1.19
PROTHROMBIN TIME: 15.1 s (ref 11.4–15.2)

## 2016-02-16 MED ORDER — BOOST / RESOURCE BREEZE PO LIQD: 1.0000 | ORAL | Status: DC

## 2016-02-16 MED ORDER — BOOST / RESOURCE BREEZE PO LIQD
1.0000 | Freq: Two times a day (BID) | ORAL | Status: DC
  Administered 2016-02-16 – 2016-02-18 (×5): 1 via ORAL

## 2016-02-16 MED ORDER — ADULT MULTIVITAMIN LIQUID CH
15.0000 mL | Freq: Every day | ORAL | Status: DC
  Administered 2016-02-16 – 2016-02-18 (×3): 15 mL via ORAL
  Filled 2016-02-16 (×3): qty 15

## 2016-02-16 NOTE — Care Management Note (Signed)
Case Management Note  Patient Details  Name: [name] MRN: [id number] Date of Birth: Jun 30, 1916  Subjective/Objective:        Lower gi bleed in a 82 year old male with syncopy            Action/Plan:  From home   Expected Discharge Date:   (unknown)               Expected Discharge Plan:  Home/Self Care  In-House Referral:  Clinical Social Work  Discharge planning Services  CM Consult  Post Acute Care Choice:    Choice offered to:     DME Arranged:    DME Agency:     HH Arranged:    [place] Agency:     Status of Service:  In process, will continue to follow  If discussed at Long Length of Stay Meetings, dates discussed:    Additional Comments:Date:  February 16, 2016 Chart reviewed for concurrent status and case management needs. Will continue to follow the patient for status change: Discharge Planning: following for needs Expected discharge date: [id number] Velva Harman, BSN, [place], [place]  Leeroy Cha, RN 02/16/2016, 8:41 AM

## 2016-02-16 NOTE — Progress Notes (Signed)
KALON WHISENHUNT [id number] DOB: March 23, 1917 DOA: 02/15/2016 PCP: Angelina Sheriff., MD  Brief narrative: 60 ? community dwelling resident 3rd degree HB s/p PPM medtronic  DDDP 06/2003 AoV disease Afib on Coumadin Mild Cardiac disease on cath  htn  hld Pernicious anemia   admitted with diverticular likely bleed Hb 14--->11 Bun/creat 29/1.0 LFT wnl INR on admit 2.0  Given vit K   Past medical history-As per Problem list Chart reviewed as below-   Consultants:  GI  Procedures:   nonne yet  Antibiotics:  none   Subjective   Awake alert No distress no bleeding no cp No sob NPO still No n/v   Objective    Interim History:   Telemetry: Paced-no furthe rinterpretation available   Objective: Vitals:   02/16/16 0300 02/16/16 0330 02/16/16 0400 02/16/16 0500  BP: (!) 162/57 (!) 144/54 124/68 (!) 145/61  Pulse: 74 60 (!) 58 60  Resp: 14 12 (!) 8 13  Temp:  98.1 F (36.7 C)    TempSrc:  Oral    SpO2: 99% 98% 96% 98%  Weight:      Height:        Intake/Output Summary (Last 24 hours) at 02/16/16 0735 Last data filed at 02/16/16 0500  Gross per 24 hour  Intake          1910.08 ml  Output             1003 ml  Net           907.08 ml    Exam:  General: eomi ncat Cardiovascular: s1s 2 rrr, no m/r/g Respiratory: clear no added soudn Abdomen:  Soft nt nd no rebound no gaurd Skin no le edema Neuro intact  Data Reviewed: Basic Metabolic Panel:  Recent Labs Lab 02/16/16 0543  NA 143  K 3.7  CL 111  CO2 26  GLUCOSE 93  BUN 21*  CREATININE 0.76  CALCIUM 9.3   Liver Function Tests:  Recent Labs Lab 02/16/16 0543  AST 20  ALT 11*  ALKPHOS 55  BILITOT 2.6*  PROT 5.9*  ALBUMIN 3.3*   No results for input(s): LIPASE, AMYLASE in the last 168 hours. No results for input(s): AMMONIA in the last 168 hours. CBC:  Recent Labs Lab 02/15/16 1518 02/16/16 0543  WBC 9.4 7.5  NEUTROABS  --  4.1  HGB 11.2* 11.1*  HCT 33.5* 32.0*   MCV 88.4 87.4  PLT 170 124*   Cardiac Enzymes: No results for input(s): CKTOTAL, CKMB, CKMBINDEX, TROPONINI in the last 168 hours. BNP: Invalid input(s): POCBNP CBG: No results for input(s): GLUCAP in the last 168 hours.  Recent Results (from the past 240 hour(s))  MRSA PCR Screening     Status: None   Collection Time: 02/15/16  7:00 PM  Result Value Ref Range Status   MRSA by PCR NEGATIVE NEGATIVE Final    Comment:        The GeneXpert MRSA Assay (FDA approved for NASAL specimens only), is one component of a comprehensive MRSA colonization surveillance program. It is not intended to diagnose MRSA infection nor to guide or monitor treatment for MRSA infections.      Studies:              All Imaging reviewed and is as per above notation   Scheduled Meds: . sodium chloride   Intravenous Once  . sodium chloride   Intravenous Once  . sodium chloride   Intravenous Once  . sodium  chloride flush  3 mL Intravenous Q12H   Continuous Infusions: . sodium chloride 75 mL/hr at 02/15/16 1935     Assessment/Plan:  1. Diverticular bleed.  Keep npo.  Hold coumadin for now.  Vit K given overnight INR 1.1 2. PPM/3rd degree hb/Afib [place] 2>4-monitor on tele for now 3. htn-cont metorprolol lower dose if bp above 140/90-holding felodipine for now 4. Mild AKI-holding felodipine 5. Pernicious anemia-cont vit B12  SCD  No family bedside Keep NPO Therapy eval am oob to chair. Await gi input  Verneita Griffes, MD  [facility] Pager [phone] 02/16/2016, 7:35 AM    LOS: 1 day

## 2016-02-16 NOTE — Progress Notes (Signed)
Initial Nutrition Assessment  DOCUMENTATION CODES:   Not applicable  INTERVENTION:  - Will order Boost Breeze BID, each supplement provides 250 kcal and 9 grams of protein - Will order daily multivitamin (liquid). - Diet advancement as medically feasible.  NUTRITION DIAGNOSIS:   Inadequate protein intake related to other (see comment) (current diet order) as evidenced by other (see comment) (CLD does not meet estimated protein needs).  GOAL:   Patient will meet greater than or equal to 90% of their needs  MONITOR:   PO intake, Supplement acceptance, Diet advancement, Weight trends, Labs, I & O's  REASON FOR ASSESSMENT:   Malnutrition Screening Tool  ASSESSMENT:   82 y.o. male, With past medical history of hypertension, CAD, atrial fibrillation on A. fib, permanent pacemaker, patient presents to the ED with bright red blood per rectum developed this morning, he denies any history of GI bleed in the past, denies any endoscopy or colonoscopy in the past as well, currently after admission, had 2 episodes of large bright red blood per rectum, report some lightheadedness upon sitting on the commode, denies any chest pain or shortness of breath, baseline hemoglobin is 13.7 on presentation at [facility], it dropped to 12.7 there, and currently is 11.2, INR was 2.2, received 2.5 mg subcutaneous vitamin K, repeat INR is 2.05 here, C8 10 mg of IV vitamin K here.  Pt seen for MST. BMI indicates normal weight status. Diet advanced from NPO to CLD earlier this AM and pt reports, at time of RD visit, that he is awaiting breakfast which including jello. He is not experiencing abdominal pain or nausea this AM. Discussion with pt was not focused on nutrition or weight PTA and focused more on listening as pt talked about his wife, upcoming anniversary, and his time in the [employer].   Will order Boost Breeze to supplement CLD. Muscle and fat stores appropriate for age. Per chart review, pt has  lost 20 lbs (14.3% body weight) in the past 2 years which is not sigificant for time frame.   Medications reviewed; 20 mg IV Lasix x1 dose yesterday, 10 mg IV vitamin K x1 dose yesterday.  Labs reviewed. IVF: NS @ 75 mL/hr.    Diet Order:  Diet clear liquid Room service appropriate? Yes; Fluid consistency: Thin  Skin:  Reviewed, no issues  Last BM:  10/16  Height:   Ht Readings from Last 1 Encounters:  02/15/16 5\' 6"  (1.676 m)    Weight:   Wt Readings from Last 1 Encounters:  02/15/16 125 lb 7.1 oz (56.9 kg)    Ideal Body Weight:  64.55 kg  BMI:  Body mass index is 20.25 kg/m.  Estimated Nutritional Needs:   Kcal:  1250-1425 (22-25 kcal/kg)  Protein:  55-65 grams  Fluid:  >/= 1.5 L/day  EDUCATION NEEDS:   No education needs identified at this time    Jarome Matin, MS, RD, LDN Inpatient Clinical Dietitian Pager # [phone] After hours/weekend pager # [phone]

## 2016-02-16 NOTE — Consult Note (Signed)
Referring Provider: Dr. Waldron Labs Primary Care Physician:  Angelina Sheriff., MD Primary Gastroenterologist:  Althia Forts  Reason for Consultation:  GI bleed  HPI: [name] is a 82 y.o. male with multiple medical problems as stated below presents with acute onset of bright red blood per rectum with loose stool yesterday morning that occurred at home. Was seen at [facility] and transferred to [facility]. He had 2 additional episodes of BRBPR after admission to [facility]. Had some lightheadedness yesterday during defecation. Denies associated abdominal pain, nausea, vomiting, chest pain, shortness of breath, fevers, or chills. Denies every having rectal bleeding in the past. He is on chronic Coumadin due to Afib and INR 2.2 on admit. Hgb 13.7 at [place] and dropped to 12.7 there. It was 11.2 on admit to [facility]. He thinks he had a colonoscopy over 10 years ago but he is not sure and results are unknown. No BMs or bleeding reported overnight.    Past Medical History:  Diagnosis Date  . Atrial fibrillation ([place])   . Bladder stones   . CAD (coronary artery disease), native coronary artery   . Cardiac pacemaker in situ 01/07/2014   Pacer insertion for third degree block 06/27/03 Dr. Lovena Le  RA lead Medtronic 5076 RV lead Medtronic 5076 serial number: [id number]   Medtronic Sigma   [id number]  . Essential hypertension   . Hyperlipidemia 01/07/2014  . Long term (current) use of anticoagulants 01/07/2014  . Nephrolithiasis   . Pernicious anemia     Past Surgical History:  Procedure Laterality Date  . APPENDECTOMY    . INGUINAL HERNIA REPAIR    . NASAL SINUS SURGERY    . PACEMAKER GENERATOR CHANGE  01/31/2014   MDT Sherril Croon dual chamber pacemaker change by Dr Lovena Le  . PACEMAKER INSERTION    . PERMANENT PACEMAKER GENERATOR CHANGE N/A 01/30/2014   Procedure: PERMANENT PACEMAKER GENERATOR CHANGE;  Surgeon: Evans Lance, MD;  Location: [facility] CATH LAB;  Service: Cardiovascular;  Laterality: N/A;     Prior to Admission medications   Medication Sig Start Date End Date Taking? Authorizing Provider  cyanocobalamin (,VITAMIN B-12,) 1000 MCG/ML injection Inject 1,000 mcg into the muscle every 30 (thirty) days.    Yes Historical Provider, MD  felodipine (PLENDIL) 2.5 MG 24 hr tablet Take 2.5 mg by mouth every morning.  01/12/14  Yes Historical Provider, MD  metoprolol succinate (TOPROL-XL) 25 MG 24 hr tablet Take 25 mg by mouth every morning.  11/30/13  Yes Historical Provider, MD  warfarin (COUMADIN) 3 MG tablet Take 3 mg by mouth every morning.  11/07/13  Yes Historical Provider, MD    Scheduled Meds: . sodium chloride   Intravenous Once  . sodium chloride   Intravenous Once  . sodium chloride   Intravenous Once  . sodium chloride flush  3 mL Intravenous Q12H   Continuous Infusions: . sodium chloride 75 mL/hr at 02/15/16 1935   PRN Meds:.acetaminophen **OR** acetaminophen  Allergies as of 02/15/2016  . (No Known Allergies)    History reviewed. No pertinent family history.  Social History   Social History  . Marital status: Married    Spouse name: N/A  . Number of children: N/A  . Years of education: N/A   Occupational History  . Not on file.   Social History Main Topics  . Smoking status: Former Smoker    Types: Pipe, Cigars    Quit date: 02/14/1974  . Smokeless tobacco: Never Used  . Alcohol use No  .  Drug use: No  . Sexual activity: No   Other Topics Concern  . Not on file   Social History Narrative  . No narrative on file    Review of Systems: All negative except as stated above in HPI.  Physical Exam: Vital signs: Vitals:   02/16/16 0500 02/16/16 0800  BP: (!) 145/61 (!) 168/66  Pulse: 60 (!) 31  Resp: 13 18  Temp:  97.7 F (36.5 C)   Last BM Date: 02/15/16 General:   Alert,  Thin, elderly, pleasant and cooperative in NAD Head: atraumatic Eyes: anicteric sclera ENT: oropharynx clear Neck: supple, nontender Lungs:  Clear throughout to  auscultation.   No wheezes, crackles, or rhonchi. No acute distress. Heart:  Regular rate and rhythm; no murmurs, clicks, rubs,  or gallops. Abdomen: soft, nontender, nondistended, +BS  Rectal:  Deferred Ext: no edema  GI:  Lab Results:  Recent Labs  02/15/16 1518 02/16/16 0543  WBC 9.4 7.5  HGB 11.2* 11.1*  HCT 33.5* 32.0*  PLT 170 124*   BMET  Recent Labs  02/16/16 0543  NA 143  K 3.7  CL 111  CO2 26  GLUCOSE 93  BUN 21*  CREATININE 0.76  CALCIUM 9.3   LFT  Recent Labs  02/16/16 0543  PROT 5.9*  ALBUMIN 3.3*  AST 20  ALT 11*  ALKPHOS 55  BILITOT 2.6*   PT/INR  Recent Labs  02/15/16 1518 02/16/16 0543  LABPROT 23.4* 15.1  INR 2.04 1.19     Studies/Results: No results found.  Impression/Plan: 82 yo with painless hematochezia likely due to a diverticular bleed that appears to be resolving. Hgb stable since admit here and no further bleeding. INR corrected. Would manage conservatively and would not recommend a colonoscopy due to his advanced age and comorbidities. If no further bleeding today, then resume Coumadin tomorrow (if deemed necessary by primary team). Clear liquids only today and if no further bleeding advance tomorrow. Ok to transfer out of step down unit. D/W Dr. Verlon Au. Will follow.    LOS: 1 day   [place] C.  02/16/2016, 8:50 AM  Pager [phone]  If no answer or after 5 PM call [phone]

## 2016-02-17 LAB — CBC WITH DIFFERENTIAL/PLATELET
Basophils Absolute: 0 10*3/uL (ref 0.0–0.1)
Basophils Relative: 1 %
EOS ABS: 0.2 10*3/uL (ref 0.0–0.7)
Eosinophils Relative: 3 %
HEMATOCRIT: 35 % — AB (ref 39.0–52.0)
HEMOGLOBIN: 11.9 g/dL — AB (ref 13.0–17.0)
LYMPHS ABS: 2.1 10*3/uL (ref 0.7–4.0)
LYMPHS PCT: 26 %
MCH: 30.7 pg (ref 26.0–34.0)
MCHC: 34 g/dL (ref 30.0–36.0)
MCV: 90.4 fL (ref 78.0–100.0)
MONOS PCT: 10 %
Monocytes Absolute: 0.9 10*3/uL (ref 0.1–1.0)
NEUTROS ABS: 5 10*3/uL (ref 1.7–7.7)
NEUTROS PCT: 60 %
Platelets: 140 10*3/uL — ABNORMAL LOW (ref 150–400)
RBC: 3.87 MIL/uL — AB (ref 4.22–5.81)
RDW: 14.3 % (ref 11.5–15.5)
WBC: 8.2 10*3/uL (ref 4.0–10.5)

## 2016-02-17 LAB — COMPREHENSIVE METABOLIC PANEL
ALK PHOS: 58 U/L (ref 38–126)
ALT: 13 U/L — AB (ref 17–63)
ANION GAP: 7 (ref 5–15)
AST: 25 U/L (ref 15–41)
Albumin: 3.4 g/dL — ABNORMAL LOW (ref 3.5–5.0)
BILIRUBIN TOTAL: 2.3 mg/dL — AB (ref 0.3–1.2)
BUN: 13 mg/dL (ref 6–20)
CALCIUM: 9.5 mg/dL (ref 8.9–10.3)
CO2: 24 mmol/L (ref 22–32)
CREATININE: 0.67 mg/dL (ref 0.61–1.24)
Chloride: 111 mmol/L (ref 101–111)
GFR calc non Af Amer: >60 mL/min (ref >60)
Glucose, Bld: 99 mg/dL (ref 65–99)
Potassium: 3.2 mmol/L — ABNORMAL LOW (ref 3.5–5.1)
Sodium: 142 mmol/L (ref 135–145)
TOTAL PROTEIN: 6.2 g/dL — AB (ref 6.5–8.1)

## 2016-02-17 LAB — PREPARE FRESH FROZEN PLASMA
UNIT DIVISION: 0
UNIT DIVISION: 0
Unit division: 0
Unit division: 0

## 2016-02-17 LAB — PROTIME-INR
INR: 1.09
Prothrombin Time: 14.2 seconds (ref 11.4–15.2)

## 2016-02-17 MED ORDER — POTASSIUM CHLORIDE CRYS ER 20 MEQ PO TBCR
40.0000 meq | EXTENDED_RELEASE_TABLET | Freq: Once | ORAL | Status: AC
  Administered 2016-02-17: 40 meq via ORAL
  Filled 2016-02-17: qty 2

## 2016-02-17 MED ORDER — METOPROLOL TARTRATE 25 MG PO TABS
12.5000 mg | ORAL_TABLET | Freq: Two times a day (BID) | ORAL | Status: DC
  Administered 2016-02-17 – 2016-02-18 (×3): 12.5 mg via ORAL
  Filled 2016-02-17 (×3): qty 1

## 2016-02-17 NOTE — Progress Notes (Signed)
Pt arrived to unit from ICU in w/c. Up to recliner chair w/ one assist. Chair alarm in place. VSS. Pt denies pain/discomfort.  Oriented to callbell and environment. POC discussed.

## 2016-02-17 NOTE — Evaluation (Signed)
Physical Therapy Evaluation Patient Details Name: [name] MRN: [id number] DOB: Oct 16, 1916 Today's Date: 02/17/2016   History of Present Illness  Pt admitted from [facility] with GIB and hx of CAD, A-fib and pacemaker  Clinical Impression  Pt admitted as above and presenting with functional mobility limitations 2* generalized weakness and ambulatory balance deficits.  Pt should progress to dc home and would benefit from follow up HHPT to further address deficits.    Follow Up Recommendations Home health PT    Equipment Recommendations  Rolling walker with 5" wheels    Recommendations for Other Services OT consult     Precautions / Restrictions Precautions Precautions: Fall Restrictions Weight Bearing Restrictions: No      Mobility  Bed Mobility               General bed mobility comments: NT - Pt OOB with nursing and declines back to bed  Transfers Overall transfer level: Needs assistance Equipment used: None Transfers: Sit to/from Stand Sit to Stand: Min assist         General transfer comment: min assist to bring wt up and fwd and to balance in intial standing  Ambulation/Gait Ambulation/Gait assistance: Min assist;Min guard Ambulation Distance (Feet): 450 Feet Assistive device: Rolling walker (2 wheeled);1 person hand held assist Gait Pattern/deviations: Step-through pattern;Decreased step length - right;Decreased step length - left;Shuffle;Trunk flexed     General Gait Details: 54' with min assist for stability and additional 400' with RW, min guard adn cues for posture and position from [employer]            Wheelchair Mobility    Modified Rankin (Stroke Patients Only)       Balance Overall balance assessment: Needs assistance Sitting-balance support: No upper extremity supported;Feet supported Sitting balance-Leahy Scale: Good     Standing balance support: No upper extremity supported Standing balance-Leahy Scale:  Fair                               Pertinent Vitals/Pain Pain Assessment: No/denies pain    Home Living Family/patient expects to be discharged to:: Private residence Living Arrangements: Spouse/significant other Available Help at Discharge: Family;Available PRN/intermittently;Personal care attendant Type of Home: House Home Access: Stairs to enter   [employer] of Steps: 1 Home Layout: One level Home Equipment: Cane - single point Additional Comments: Pt has "a woman who comes in" to assist with home making, cooking and "takes care of me and my wife"  for 3 hrs a day 7 days a week    Prior Function Level of Independence: Independent with assistive device(s)               Hand Dominance        Extremity/Trunk Assessment   Upper Extremity Assessment: Generalized weakness           Lower Extremity Assessment: Generalized weakness      Cervical / Trunk Assessment: Kyphotic  Communication   Communication: No difficulties  Cognition Arousal/Alertness: Awake/alert Behavior During Therapy: WFL for tasks assessed/performed Overall Cognitive Status: Within Functional Limits for tasks assessed                      General Comments      Exercises     Assessment/Plan    PT Assessment Patient needs continued PT services  PT Problem List Decreased strength;Decreased activity tolerance;Decreased balance;Decreased mobility;Decreased knowledge of use  of DME          PT Treatment Interventions DME instruction;Gait training;Stair training;Functional mobility training;Therapeutic activities;Therapeutic exercise;Balance training;Patient/family education    PT Goals (Current goals can be found in the Care Plan section)  Acute Rehab PT Goals Patient Stated Goal: Regain IND to return home with spouse PT Goal Formulation: With patient Time For Goal Achievement: 02/27/16 Potential to Achieve Goals: Good    Frequency Min 3X/week    Barriers to discharge        Co-evaluation               End of Session Equipment Utilized During Treatment: Gait belt Activity Tolerance: Patient tolerated treatment well Patient left: in chair;with call bell/phone within reach;with family/visitor present Nurse Communication: Mobility status         Time: [id number] PT Time Calculation (min) (ACUTE ONLY): 20 min   Charges:   PT Evaluation $PT Eval Low Complexity: 1 Procedure     PT G Codes:        Tehya Leath 03/03/16, 2:46 PM

## 2016-02-17 NOTE — Progress Notes (Signed)
[name] [id number] DOB: July 21, 1916 DOA: 02/15/2016 PCP: Angelina Sheriff., MD  Brief narrative: 13 ? community dwelling resident 3rd degree HB s/p PPM [employer]tronic  DDDP 06/2003 AoV disease Afib on Coumadin Mild Cardiac disease on cath  htn  hld Pernicious anemia   admitted with diverticular  bleed Hb 14--->11 Bun/creat 29/1.0 LFT wnl INR on admit 2.0  Given vit K/ffp /prbc   Past medical history-As per Problem list Chart reviewed as below-   Consultants:  GI  Procedures:   none  Antibiotics:  none   Subjective   Awake alert No distress no bleeding no cp No sob Tolerating clears No n/v , no abdominal pain, no fever, sitting up in chair, son in room   Objective    Interim History:   Telemetry: Paced-no furthe rinterpretation available   Objective: Vitals:   02/17/16 0401 02/17/16 0500 02/17/16 0600 02/17/16 0731  BP:  (!) 122/59 121/71   Pulse: (!) 59 (!) 55 (!) 55   Resp: 14 14 13    Temp:    98.6 F (37 C)  TempSrc:    Oral  SpO2: 98% 96% 98%   Weight:      Height:        Intake/Output Summary (Last 24 hours) at 02/17/16 1000 Last data filed at 02/17/16 0730  Gross per 24 hour  Intake             1905 ml  Output             1850 ml  Net               55 ml    Exam:  General: eomi ncat Cardiovascular: s1s 2 rrr, no m/r/g Respiratory: clear no added soudn Abdomen:  Soft nt nd no rebound no gaurd Skin no le edema Neuro intact  Data Reviewed: Basic Metabolic Panel:  Recent Labs Lab 02/16/16 0543 02/17/16 0316  NA 143 142  K 3.7 3.2*  CL 111 111  CO2 26 24  GLUCOSE 93 99  BUN 21* 13  CREATININE 0.76 0.67  CALCIUM 9.3 9.5   Liver Function Tests:  Recent Labs Lab 02/16/16 0543 02/17/16 0316  AST 20 25  ALT 11* 13*  ALKPHOS 55 58  BILITOT 2.6* 2.3*  PROT 5.9* 6.2*  ALBUMIN 3.3* 3.4*   No results for input(s): LIPASE, AMYLASE in the last 168 hours. No results for input(s): AMMONIA in the last 168  hours. CBC:  Recent Labs Lab 02/15/16 1518 02/16/16 0543 02/17/16 0316  WBC 9.4 7.5 8.2  NEUTROABS  --  4.1 5.0  HGB 11.2* 11.1* 11.9*  HCT 33.5* 32.0* 35.0*  MCV 88.4 87.4 90.4  PLT 170 124* 140*   Cardiac Enzymes: No results for input(s): CKTOTAL, CKMB, CKMBINDEX, TROPONINI in the last 168 hours. BNP: Invalid input(s): POCBNP CBG: No results for input(s): GLUCAP in the last 168 hours.  Recent Results (from the past 240 hour(s))  MRSA PCR Screening     Status: None   Collection Time: 02/15/16  7:00 PM  Result Value Ref Range Status   MRSA by PCR NEGATIVE NEGATIVE Final    Comment:        The GeneXpert MRSA Assay (FDA approved for NASAL specimens only), is one component of a comprehensive MRSA colonization surveillance program. It is not intended to diagnose MRSA infection nor to guide or monitor treatment for MRSA infections.      Studies:  All Imaging reviewed and is as per above notation   Scheduled Meds: . sodium chloride   Intravenous Once  . sodium chloride   Intravenous Once  . sodium chloride   Intravenous Once  . feeding supplement  1 Container Oral BID BM  . metoprolol tartrate  12.5 mg Oral BID  . multivitamin  15 mL Oral Daily  . potassium chloride  40 mEq Oral Once  . sodium chloride flush  3 mL Intravenous Q12H   Continuous Infusions: . sodium chloride 75 mL/hr at 02/17/16 0721     Assessment/Plan:  1. Diverticular bleed.  S/p vitk, prbc and ffp ,  coumadin held since admission.  No more active bleed, vital stable, hgb stable, Gi following 2. PPM/3rd degree hb/Afib Cahd 2>4-bp stabilized, restart betablocker, hold coumadin, I have discussed with patient and his son at bedside, family prefer to hold coumadin for a few days and likely will restart coumadin if no more bleed and has target INR at lower end of therapeutic range, family and patient accept the risk of stroke while holding coumadin, they also understand that if patient  bleed again after restart coumadin, he may not be a candidate for chronic anticoagulation. 3. htn-restart  metorprolol with holding parameters, continue hole felodipine 4.  Mild AKI/azotemia: improving, holding felodipine 5. Pernicious anemia-cont vit B12 monthly 6. Hypokalemia: replace k  SCD  Patient and his Son in room Therapy eval , transfer to med tele Need gi clearance for discharge, likely home with home health on 10/19 if no more bleed, able to tolerate diet advancement   Florencia Reasons, MD PhD  [facility] Pager [phone] 02/17/2016, 10:00 AM    LOS: 2 days

## 2016-02-17 NOTE — Progress Notes (Signed)
[facility] Gastroenterology Progress Note  ZEBULAN MICHELENA 82 y.o. Jul 14, 1916   Subjective: Feels good. Sitting in bedside chair eating lunch. Denies abdominal pain. Denies any further rectal bleeding.  Objective: Vital signs in last 24 hours: Vitals:   02/17/16 1100 02/17/16 1145  BP: (!) 135/105   Pulse: 78   Resp: 14   Temp:  98.4 F (36.9 C)    Physical Exam: Gen: alert, no acute distress, elderly, frail, thin HEENT: anicteric sclera CV: RRR Chest: CTA B Abd: soft, nontender, nondistended  Lab Results:  Recent Labs  02/16/16 0543 02/17/16 0316  NA 143 142  K 3.7 3.2*  CL 111 111  CO2 26 24  GLUCOSE 93 99  BUN 21* 13  CREATININE 0.76 0.67  CALCIUM 9.3 9.5    Recent Labs  02/16/16 0543 02/17/16 0316  AST 20 25  ALT 11* 13*  ALKPHOS 55 58  BILITOT 2.6* 2.3*  PROT 5.9* 6.2*  ALBUMIN 3.3* 3.4*    Recent Labs  02/16/16 0543 02/17/16 0316  WBC 7.5 8.2  NEUTROABS 4.1 5.0  HGB 11.1* 11.9*  HCT 32.0* 35.0*  MCV 87.4 90.4  PLT 124* 140*    Recent Labs  02/16/16 0543 02/17/16 0316  LABPROT 15.1 14.2  INR 1.19 1.09      Assessment/Plan: 82 yo s/p lower GI bleed likely diverticular in origin that has resolved. Hgb stable. Coumadin restart per primary team. Advance diet as tolerated to heart healthy diet. Dr. Phoebe Sharps note from today noted. If no further bleeding, then ok to go home tomorrow from GI standpoint. No f/u with GI needed. Will sign off. Call if questions.   Johnson C. 02/17/2016, 12:42 PM  Pager [phone]  If no answer or after 5 PM call [phone] ID: Bobbie Stack, male   DOB: Sep 10, 1916, 82 y.o.   MRN: [id number]

## 2016-02-17 NOTE — Care Management Note (Signed)
Case Management Note  Patient Details  Name: [name] MRN: [id number] Date of Birth: June 12, 1916  Subjective/Objective: Transfer from SDU. From home. Would recc PT cons.                   Action/Plan:d/c plan home.   Expected Discharge Date:   (unknown)               Expected Discharge Plan:  Home/Self Care  In-House Referral:  Clinical Social Work  Discharge planning Services  CM Consult  Post Acute Care Choice:    Choice offered to:     DME Arranged:    DME Agency:     HH Arranged:    HH Agency:     Status of Service:  In process, will continue to follow  If discussed at Long Length of Stay Meetings, dates discussed:    Additional Comments:  Dessa Phi, RN 02/17/2016, 1:39 PM

## 2016-02-18 LAB — COMPREHENSIVE METABOLIC PANEL
ALT: 17 U/L (ref 17–63)
AST: 27 U/L (ref 15–41)
Albumin: 3.4 g/dL — ABNORMAL LOW (ref 3.5–5.0)
Alkaline Phosphatase: 61 U/L (ref 38–126)
Anion gap: 5 (ref 5–15)
BILIRUBIN TOTAL: 1.7 mg/dL — AB (ref 0.3–1.2)
BUN: 16 mg/dL (ref 6–20)
CO2: 23 mmol/L (ref 22–32)
CREATININE: 0.73 mg/dL (ref 0.61–1.24)
Calcium: 9.6 mg/dL (ref 8.9–10.3)
Chloride: 112 mmol/L — ABNORMAL HIGH (ref 101–111)
Glucose, Bld: 99 mg/dL (ref 65–99)
Potassium: 3.7 mmol/L (ref 3.5–5.1)
Sodium: 140 mmol/L (ref 135–145)
TOTAL PROTEIN: 6.3 g/dL — AB (ref 6.5–8.1)

## 2016-02-18 LAB — CBC
HEMATOCRIT: 33.7 % — AB (ref 39.0–52.0)
Hemoglobin: 11.3 g/dL — ABNORMAL LOW (ref 13.0–17.0)
MCH: 30.2 pg (ref 26.0–34.0)
MCHC: 33.5 g/dL (ref 30.0–36.0)
MCV: 90.1 fL (ref 78.0–100.0)
Platelets: 134 10*3/uL — ABNORMAL LOW (ref 150–400)
RBC: 3.74 MIL/uL — AB (ref 4.22–5.81)
RDW: 14.2 % (ref 11.5–15.5)
WBC: 8.7 10*3/uL (ref 4.0–10.5)

## 2016-02-18 LAB — MAGNESIUM: MAGNESIUM: 1.8 mg/dL (ref 1.7–2.4)

## 2016-02-18 MED ORDER — WARFARIN SODIUM 3 MG PO TABS: ORAL_TABLET | ORAL | 0 refills | Status: AC

## 2016-02-18 MED ORDER — MAGNESIUM OXIDE 400 (241.3 MG) MG PO TABS: 400.0000 mg | ORAL_TABLET | Freq: Every day | ORAL | 0 refills | Status: AC

## 2016-02-18 MED ORDER — BOOST / RESOURCE BREEZE PO LIQD: 1.0000 | Freq: Two times a day (BID) | ORAL | 0 refills | Status: AC

## 2016-02-18 MED ORDER — SENNOSIDES-DOCUSATE SODIUM 8.6-50 MG PO TABS: 1.0000 | ORAL_TABLET | Freq: Every day | ORAL | 0 refills | Status: AC

## 2016-02-18 MED ORDER — MAGNESIUM OXIDE 400 (241.3 MG) MG PO TABS
400.0000 mg | ORAL_TABLET | Freq: Every day | ORAL | Status: DC
  Administered 2016-02-18: 400 mg via ORAL
  Filled 2016-02-18: qty 1

## 2016-02-18 MED ORDER — POTASSIUM CHLORIDE CRYS ER 20 MEQ PO TBCR
40.0000 meq | EXTENDED_RELEASE_TABLET | Freq: Once | ORAL | Status: AC
  Administered 2016-02-18: 40 meq via ORAL
  Filled 2016-02-18: qty 2

## 2016-02-18 NOTE — Care Management Note (Signed)
Case Management Note  Patient Details  Name: CHANSTON ROYAL MRN: [id number] Date of Birth: Apr 09, 1917  Subjective/Objective:  [facility] not able to accept patient.[facility] able to accept patient-son patient aware & agree to [facility] @ home. Patient has rw-[facility] No further CM needs.              Action/Plan:d/c home w/HHC   Expected Discharge Date:   (unknown)               Expected Discharge Plan:  [place]  In-House Referral:  Clinical Social Work  Discharge planning Services  CM Consult  Post Acute Care Choice:    Choice offered to:  Adult Children  DME Arranged:  Walker rolling DME Agency:  [place] Arranged:  PT [place] Agency:  [facility]  Status of Service:  Completed, signed off  If discussed at [place] of Stay Meetings, dates discussed:    Additional Comments:  Dessa Phi, RN 02/18/2016, 2:06 PM

## 2016-02-18 NOTE — Progress Notes (Signed)
Physical Therapy Treatment Patient Details Name: [name] MRN: [id number] DOB: [birth year] Today's Date: 02/18/2016    History of Present Illness Pt admitted from [facility] with GIB and hx of CAD, A-fib and pacemaker    PT Comments    Pt OOB in recliner.  Assisted with amb a great distance in hallway using walker.    Follow Up Recommendations  Home health PT     Equipment Recommendations  Rolling walker with 5" wheels    Recommendations for Other Services       Precautions / Restrictions Restrictions Weight Bearing Restrictions: No    Mobility  Bed Mobility               General bed mobility comments: NT - Pt OOB with nursing and declines back to bed  Transfers Overall transfer level: Needs assistance Equipment used: None Transfers: Sit to/from Stand Sit to Stand: Min assist;Min guard         General transfer comment: min assist to bring wt up and fwd and to balance in intial standing  Ambulation/Gait Ambulation/Gait assistance: Min guard;Min assist Ambulation Distance (Feet): 285 Feet Assistive device: Rolling walker (2 wheeled) Gait Pattern/deviations: Step-through pattern;Trunk flexed Gait velocity: WFL   General Gait Details: good alternating gait with functional speed.  No LOB.  <25% VC's safety with turns using walker.     Stairs            Wheelchair Mobility    Modified Rankin (Stroke Patients Only)       Balance                                    Cognition Arousal/Alertness: Awake/alert Behavior During Therapy: WFL for tasks assessed/performed Overall Cognitive Status: Within Functional Limits for tasks assessed                      Exercises      General Comments        Pertinent Vitals/Pain Pain Assessment: No/denies pain    Home Living                      Prior Function            PT Goals (current goals can now be found in the care plan section) Progress  towards PT goals: Progressing toward goals    Frequency    Min 3X/week      PT Plan Current plan remains appropriate    Co-evaluation             End of Session Equipment Utilized During Treatment: Gait belt Activity Tolerance: Patient tolerated treatment well Patient left: in chair;with call bell/phone within reach;with family/visitor present     Time: 1045-1100 PT Time Calculation (min) (ACUTE ONLY): 15 min  Charges:  $Gait Training: 8-22 mins                    G Codes:      Rica Koyanagi  PTA [facility]  Acute  Rehab Pager      [phone]

## 2016-02-18 NOTE — Discharge Summary (Addendum)
Discharge Summary  [name] [id number] DOB: [birth year]  PCP: Bradley Sheriff., MD  Admit date: 02/15/2016 Discharge date: 02/18/2016  Time spent: <48mins  Recommendations for Outpatient Follow-up:  1. F/u with PMD in three days  for hospital discharge follow up, repeat cbc/bmp at follow up, pmd to discuss resuming coumadin at hospital follow up 2. F/u with [facility] as needed for gi bleed   Discharge Diagnoses:  Active Hospital Problems   Diagnosis Date Noted  . GI bleed 02/15/2016  . Symptomatic anemia 02/15/2016  . Cardiac pacemaker in situ 01/07/2014  . Long term current use of anticoagulant therapy 01/07/2014  . CAD (coronary artery disease), native coronary artery   . Essential hypertension   . Paroxysmal atrial fibrillation [name])     Resolved Hospital Problems   Diagnosis Date Noted Date Resolved  No resolved problems to display.    Discharge Condition: stable  Diet recommendation: heart healthy  Filed Weights   02/15/16 1400 02/15/16 1903  Weight: 54 kg (119 lb) 56.9 kg (125 lb 7.1 oz)    History of present illness:  [name]  is a 82 y.o. male, With past medical history of hypertension, CAD, atrial fibrillation on A. fib, permanent pacemaker, patient presents to the ED with bright red blood per rectum developed this morning, he denies any history of GI bleed in the past, denies any endoscopy or colonoscopy in the past as well, currently after admission, had 2 episodes of large bright red blood per rectum, report some lightheadedness upon sitting on the commode, denies any chest pain or shortness of breath, baseline hemoglobin is 13.7 on presentation at [facility], it dropped to 12.7 there, and currently is 11.2, INR was 2.2, received 2.5 mg subcutaneous vitamin K, repeat INR is 2.05 here, C8 10 mg of IV vitamin K here. Patient denies any coffee-ground emesis, nausea or vomiting, no abdominal pain, no fever or chills.  Hospital Course:    Active Problems:   Cardiac pacemaker in situ   CAD (coronary artery disease), native coronary artery   Paroxysmal atrial fibrillation ([place])   Long term current use of anticoagulant therapy   Essential hypertension   GI bleed   Symptomatic anemia  1. Diverticular bleed., presented with hypotension and acute blood loss anemia,  S/p vitk10mg x1, prbcx2 and ffpx2 ,  coumadin held since admission.  No more active bleed, vital stable, hgb stable, tolerating diet advancement, [facility] consulted, input appreciate.  2. S/p Pacemker for 3rd degree heart block/Afib Cahd 2>4,-bp stabilized, restart betablocker, hold coumadin, I have discussed with patient and his son at bedside, family prefer to hold coumadin for a few days and likely will restart coumadin if no more bleed and has target INR at lower end of therapeutic range, family and patient accept the risk of stroke while holding coumadin, they also understand that if patient bleed again after restart coumadin, he may not be a candidate for chronic anticoagulation. Patient to follow up with pmd in three days to discuss resuming coumadin and inr monitoring. 3. htn-presented with hypotension, bp meds held initially, bp has stabilized, home toprolxl resumed at discharge. discontinued felodipine 4.  Mild AKI/azotemia: azotemia resolved.  5. Pernicious anemia-cont vit B12 monthly by pmd 6. Hypokalemia: replaced k 7. FTT home health with PT.   Procedures:  none  Consultations:  [facility]  Discharge Exam: BP (!) 118/59 (BP Location: Left Arm)   Pulse 70   Temp 97.9 F (36.6 C) (Oral)   Resp  14   Ht 5\' 6"  (1.676 m)   Wt 56.9 kg (125 lb 7.1 oz)   SpO2 99%   BMI 20.25 kg/m   General: NAD, pleasant, AAOx3, pleasant, does has memory issues , likely at baseline. Cardiovascular: paced rhythm Respiratory: CTABL Extremity: no edema  Discharge Instructions You were cared for by a hospitalist during your hospital stay. If you have any questions  about your discharge medications or the care you received while you were in the hospital after you are discharged, you can call the unit and asked to speak with the hospitalist on call if the hospitalist that took care of you is not available. Once you are discharged, your primary care physician will handle any further medical issues. Please note that NO REFILLS for any discharge medications will be authorized once you are discharged, as it is imperative that you return to your primary care physician (or establish a relationship with a primary care physician if you do not have one) for your aftercare needs so that they can reassess your need for medications and monitor your lab values.  Discharge Instructions    Diet general    Complete by:  As directed    Heart health diet   Face-to-face encounter (required for Medicare/Medicaid patients)    Complete by:  As directed    I Viral Schramm certify that this patient is under my care and that I, or a nurse practitioner or physician's assistant working with me, had a face-to-face encounter that meets the physician face-to-face encounter requirements with this patient on 02/17/2016. The encounter with the patient was in whole, or in part for the following medical condition(s) which is the primary reason for home health care (List medical condition): FTT   The encounter with the patient was in whole, or in part, for the following medical condition, which is the primary reason for home health care:  FTT   I certify that, based on my findings, the following services are medically necessary home health services:  Physical therapy   Reason for Medically Necessary Home Health Services:  Therapy- Personnel officer, Public librarian   My clinical findings support the need for the above services:  Unable to leave home safely without assistance and/or assistive device   Further, I certify that my clinical findings support that this patient is homebound due to:   Unable to leave home safely without assistance   Home Health    Complete by:  As directed    To provide the following care/treatments:  PT   Increase activity slowly    Complete by:  As directed    Walker rolling    Complete by:  As directed        Medication List    STOP taking these medications   felodipine 2.5 MG 24 hr tablet Commonly known as:  PLENDIL     TAKE these medications   cyanocobalamin 1000 MCG/ML injection Commonly known as:  (VITAMIN B-12) Inject 1,000 mcg into the muscle every 30 (thirty) days.   feeding supplement Liqd Take 1 Container by mouth 2 (two) times daily between meals.   magnesium oxide 400 (241.3 Mg) MG tablet Commonly known as:  MAG-OX Take 1 tablet (400 mg total) by mouth daily.   metoprolol succinate 25 MG 24 hr tablet Commonly known as:  TOPROL-XL Take 25 mg by mouth every morning.   senna-docusate 8.6-50 MG tablet Commonly known as:  Senokot-S Take 1 tablet by mouth at bedtime.  warfarin 3 MG tablet Commonly known as:  COUMADIN Hold coumadin for another few days, please coordinate with your primary care doctor to resume coumadin and check INR , target INR to lower end of therapeutic range around 2.2, if rebleed on resuming coumadin, may not be a good candidate for long term use of coumadin, need to continue discuss with primary care doctor. What changed:  how much to take  how to take this  when to take this  additional instructions      No Known Allergies Follow-up Information    [facility] Angelique Blonder., MD Follow up in 3 day(s).   Specialty:  Family Medicine Why:  hospital discharge follow up, repeat cbc/bmp/inr at follow up, resume coumadin and close monitor inr at primary care doctor follow up. Contact information: [place] [address] [phone]        [place]., MD Follow up in 1 month(s).   Specialty:  Gastroenterology Why:  as needed for gi bleed Contact information: [address] [place] [address] [phone]            The results of significant diagnostics from this hospitalization (including imaging, microbiology, ancillary and laboratory) are listed below for reference.    Significant Diagnostic Studies: No results found.  Microbiology: Recent Results (from the past 240 hour(s))  MRSA PCR Screening     Status: None   Collection Time: 02/15/16  7:00 PM  Result Value Ref Range Status   MRSA by PCR NEGATIVE NEGATIVE Final    Comment:        The GeneXpert MRSA Assay (FDA approved for NASAL specimens only), is one component of a comprehensive MRSA colonization surveillance program. It is not intended to diagnose MRSA infection nor to guide or monitor treatment for MRSA infections.      Labs: Basic Metabolic Panel:  Recent Labs Lab 02/16/16 0543 02/17/16 0316 02/18/16 0548  NA 143 142 140  K 3.7 3.2* 3.7  CL 111 111 112*  CO2 26 24 23   GLUCOSE 93 99 99  BUN 21* 13 16  CREATININE 0.76 0.67 0.73  CALCIUM 9.3 9.5 9.6  MG  --   --  1.8   Liver Function Tests:  Recent Labs Lab 02/16/16 0543 02/17/16 0316 02/18/16 0548  AST 20 25 27   ALT 11* 13* 17  ALKPHOS 55 58 61  BILITOT 2.6* 2.3* 1.7*  PROT 5.9* 6.2* 6.3*  ALBUMIN 3.3* 3.4* 3.4*   No results for input(s): LIPASE, AMYLASE in the last 168 hours. No results for input(s): AMMONIA in the last 168 hours. CBC:  Recent Labs Lab 02/15/16 1518 02/16/16 0543 02/17/16 0316 02/18/16 0548  WBC 9.4 7.5 8.2 8.7  NEUTROABS  --  4.1 5.0  --   HGB 11.2* 11.1* 11.9* 11.3*  HCT 33.5* 32.0* 35.0* 33.7*  MCV 88.4 87.4 90.4 90.1  PLT 170 124* 140* 134*   Cardiac Enzymes: No results for input(s): CKTOTAL, CKMB, CKMBINDEX, TROPONINI in the last 168 hours. BNP: BNP (last 3 results) No results for input(s): BNP in the last 8760 hours.  ProBNP (last 3 results) No results for input(s): PROBNP in the last 8760 hours.  CBG: No results for input(s): GLUCAP in  the last 168 hours.     SignedFlorencia Reasons MD, PhD  [facility] 02/18/2016, 11:21 AM

## 2016-02-18 NOTE — Care Management Note (Signed)
Case Management Note  Patient Details  Name: [name] MRN: [id number] Date of Birth: 10/18/1916  Subjective/Objective: Ordered for HHPT. Spoke to patient-in rm/son(on phone)-chose [facility] for HHPT. TC [facility] rep Manuela Schwartz accepted , & aware of d/c today. Awaiting home rw order-MD paged. [facility] dme rep Jermaine aware of d/c, & await home rw order prior to delivery to rm. Patient has private caregiver service already established @ home.                  Action/Plan:d/c home w/[facility].   Expected Discharge Date:   (unknown)               Expected Discharge Plan:  [place]  In-House Referral:  Clinical Social Work  Discharge planning Services  CM Consult  Post Acute Care Choice:    Choice offered to:  Adult Children  DME Arranged:  [place] [facility] DME Agency:  [place] Arranged:  PT [place] Agency:  [place]  Status of Service:  Completed, signed off  If discussed at [place] of Stay Meetings, dates discussed:    Additional Comments:  Dessa Phi, RN 02/18/2016, 11:19 AM

## 2016-02-21 DIAGNOSIS — H548 Legal blindness, as defined in USA: Secondary | ICD-10-CM | POA: Diagnosis not present

## 2016-02-21 DIAGNOSIS — I10 Essential (primary) hypertension: Secondary | ICD-10-CM | POA: Diagnosis not present

## 2016-02-21 DIAGNOSIS — D510 Vitamin B12 deficiency anemia due to intrinsic factor deficiency: Secondary | ICD-10-CM | POA: Diagnosis not present

## 2016-02-21 DIAGNOSIS — K5793 Diverticulitis of intestine, part unspecified, without perforation or abscess with bleeding: Secondary | ICD-10-CM | POA: Diagnosis not present

## 2016-02-21 DIAGNOSIS — I359 Nonrheumatic aortic valve disorder, unspecified: Secondary | ICD-10-CM | POA: Diagnosis not present

## 2016-02-21 DIAGNOSIS — I480 Paroxysmal atrial fibrillation: Secondary | ICD-10-CM | POA: Diagnosis not present

## 2016-02-23 DIAGNOSIS — D510 Vitamin B12 deficiency anemia due to intrinsic factor deficiency: Secondary | ICD-10-CM | POA: Diagnosis not present

## 2016-02-23 DIAGNOSIS — Z2821 Immunization not carried out because of patient refusal: Secondary | ICD-10-CM | POA: Diagnosis not present

## 2016-02-23 DIAGNOSIS — I480 Paroxysmal atrial fibrillation: Secondary | ICD-10-CM | POA: Diagnosis not present

## 2016-02-23 DIAGNOSIS — K922 Gastrointestinal hemorrhage, unspecified: Secondary | ICD-10-CM | POA: Diagnosis not present

## 2016-02-23 DIAGNOSIS — R5383 Other fatigue: Secondary | ICD-10-CM | POA: Diagnosis not present

## 2016-02-23 DIAGNOSIS — E7800 Pure hypercholesterolemia, unspecified: Secondary | ICD-10-CM | POA: Diagnosis not present

## 2016-02-24 DIAGNOSIS — I359 Nonrheumatic aortic valve disorder, unspecified: Secondary | ICD-10-CM | POA: Diagnosis not present

## 2016-02-24 DIAGNOSIS — I480 Paroxysmal atrial fibrillation: Secondary | ICD-10-CM | POA: Diagnosis not present

## 2016-02-24 DIAGNOSIS — K5793 Diverticulitis of intestine, part unspecified, without perforation or abscess with bleeding: Secondary | ICD-10-CM | POA: Diagnosis not present

## 2016-02-24 DIAGNOSIS — H548 Legal blindness, as defined in USA: Secondary | ICD-10-CM | POA: Diagnosis not present

## 2016-02-24 DIAGNOSIS — D510 Vitamin B12 deficiency anemia due to intrinsic factor deficiency: Secondary | ICD-10-CM | POA: Diagnosis not present

## 2016-02-24 DIAGNOSIS — I10 Essential (primary) hypertension: Secondary | ICD-10-CM | POA: Diagnosis not present

## 2016-02-29 DIAGNOSIS — I359 Nonrheumatic aortic valve disorder, unspecified: Secondary | ICD-10-CM | POA: Diagnosis not present

## 2016-02-29 DIAGNOSIS — I480 Paroxysmal atrial fibrillation: Secondary | ICD-10-CM | POA: Diagnosis not present

## 2016-02-29 DIAGNOSIS — I10 Essential (primary) hypertension: Secondary | ICD-10-CM | POA: Diagnosis not present

## 2016-02-29 DIAGNOSIS — H548 Legal blindness, as defined in USA: Secondary | ICD-10-CM | POA: Diagnosis not present

## 2016-02-29 DIAGNOSIS — D510 Vitamin B12 deficiency anemia due to intrinsic factor deficiency: Secondary | ICD-10-CM | POA: Diagnosis not present

## 2016-02-29 DIAGNOSIS — K5793 Diverticulitis of intestine, part unspecified, without perforation or abscess with bleeding: Secondary | ICD-10-CM | POA: Diagnosis not present

## 2016-03-01 DIAGNOSIS — K625 Hemorrhage of anus and rectum: Secondary | ICD-10-CM | POA: Diagnosis not present

## 2016-03-02 DIAGNOSIS — I480 Paroxysmal atrial fibrillation: Secondary | ICD-10-CM | POA: Diagnosis not present

## 2016-03-02 DIAGNOSIS — D510 Vitamin B12 deficiency anemia due to intrinsic factor deficiency: Secondary | ICD-10-CM | POA: Diagnosis not present

## 2016-03-02 DIAGNOSIS — K5793 Diverticulitis of intestine, part unspecified, without perforation or abscess with bleeding: Secondary | ICD-10-CM | POA: Diagnosis not present

## 2016-03-02 DIAGNOSIS — I10 Essential (primary) hypertension: Secondary | ICD-10-CM | POA: Diagnosis not present

## 2016-03-02 DIAGNOSIS — I359 Nonrheumatic aortic valve disorder, unspecified: Secondary | ICD-10-CM | POA: Diagnosis not present

## 2016-03-02 DIAGNOSIS — H548 Legal blindness, as defined in USA: Secondary | ICD-10-CM | POA: Diagnosis not present

## 2016-03-08 DIAGNOSIS — D510 Vitamin B12 deficiency anemia due to intrinsic factor deficiency: Secondary | ICD-10-CM | POA: Diagnosis not present

## 2016-03-08 DIAGNOSIS — I480 Paroxysmal atrial fibrillation: Secondary | ICD-10-CM | POA: Diagnosis not present

## 2016-03-08 DIAGNOSIS — I359 Nonrheumatic aortic valve disorder, unspecified: Secondary | ICD-10-CM | POA: Diagnosis not present

## 2016-03-08 DIAGNOSIS — H548 Legal blindness, as defined in USA: Secondary | ICD-10-CM | POA: Diagnosis not present

## 2016-03-08 DIAGNOSIS — I10 Essential (primary) hypertension: Secondary | ICD-10-CM | POA: Diagnosis not present

## 2016-03-08 DIAGNOSIS — K5793 Diverticulitis of intestine, part unspecified, without perforation or abscess with bleeding: Secondary | ICD-10-CM | POA: Diagnosis not present

## 2016-03-09 DIAGNOSIS — K5793 Diverticulitis of intestine, part unspecified, without perforation or abscess with bleeding: Secondary | ICD-10-CM | POA: Diagnosis not present

## 2016-03-09 DIAGNOSIS — H548 Legal blindness, as defined in USA: Secondary | ICD-10-CM | POA: Diagnosis not present

## 2016-03-09 DIAGNOSIS — I10 Essential (primary) hypertension: Secondary | ICD-10-CM | POA: Diagnosis not present

## 2016-03-09 DIAGNOSIS — D510 Vitamin B12 deficiency anemia due to intrinsic factor deficiency: Secondary | ICD-10-CM | POA: Diagnosis not present

## 2016-03-09 DIAGNOSIS — I480 Paroxysmal atrial fibrillation: Secondary | ICD-10-CM | POA: Diagnosis not present

## 2016-03-09 DIAGNOSIS — I359 Nonrheumatic aortic valve disorder, unspecified: Secondary | ICD-10-CM | POA: Diagnosis not present

## 2016-03-10 DIAGNOSIS — D510 Vitamin B12 deficiency anemia due to intrinsic factor deficiency: Secondary | ICD-10-CM | POA: Diagnosis not present

## 2016-03-10 DIAGNOSIS — K5793 Diverticulitis of intestine, part unspecified, without perforation or abscess with bleeding: Secondary | ICD-10-CM | POA: Diagnosis not present

## 2016-03-11 DIAGNOSIS — I10 Essential (primary) hypertension: Secondary | ICD-10-CM | POA: Diagnosis not present

## 2016-03-11 DIAGNOSIS — Z0000 Encounter for general adult medical examination without abnormal findings: Secondary | ICD-10-CM | POA: Diagnosis not present

## 2016-03-11 DIAGNOSIS — E785 Hyperlipidemia, unspecified: Secondary | ICD-10-CM | POA: Diagnosis not present

## 2016-03-11 DIAGNOSIS — Z2821 Immunization not carried out because of patient refusal: Secondary | ICD-10-CM | POA: Diagnosis not present

## 2016-03-11 DIAGNOSIS — Z8719 Personal history of other diseases of the digestive system: Secondary | ICD-10-CM | POA: Diagnosis not present

## 2016-03-11 DIAGNOSIS — Z1389 Encounter for screening for other disorder: Secondary | ICD-10-CM | POA: Diagnosis not present

## 2016-03-11 DIAGNOSIS — Z79899 Other long term (current) drug therapy: Secondary | ICD-10-CM | POA: Diagnosis not present

## 2016-03-15 DIAGNOSIS — I480 Paroxysmal atrial fibrillation: Secondary | ICD-10-CM | POA: Diagnosis not present

## 2016-03-15 DIAGNOSIS — I10 Essential (primary) hypertension: Secondary | ICD-10-CM | POA: Diagnosis not present

## 2016-03-15 DIAGNOSIS — I359 Nonrheumatic aortic valve disorder, unspecified: Secondary | ICD-10-CM | POA: Diagnosis not present

## 2016-03-15 DIAGNOSIS — D510 Vitamin B12 deficiency anemia due to intrinsic factor deficiency: Secondary | ICD-10-CM | POA: Diagnosis not present

## 2016-03-15 DIAGNOSIS — H548 Legal blindness, as defined in USA: Secondary | ICD-10-CM | POA: Diagnosis not present

## 2016-03-15 DIAGNOSIS — K5793 Diverticulitis of intestine, part unspecified, without perforation or abscess with bleeding: Secondary | ICD-10-CM | POA: Diagnosis not present

## 2016-03-17 DIAGNOSIS — I10 Essential (primary) hypertension: Secondary | ICD-10-CM | POA: Diagnosis not present

## 2016-03-17 DIAGNOSIS — H548 Legal blindness, as defined in USA: Secondary | ICD-10-CM | POA: Diagnosis not present

## 2016-03-17 DIAGNOSIS — K5793 Diverticulitis of intestine, part unspecified, without perforation or abscess with bleeding: Secondary | ICD-10-CM | POA: Diagnosis not present

## 2016-03-17 DIAGNOSIS — D510 Vitamin B12 deficiency anemia due to intrinsic factor deficiency: Secondary | ICD-10-CM | POA: Diagnosis not present

## 2016-03-17 DIAGNOSIS — I480 Paroxysmal atrial fibrillation: Secondary | ICD-10-CM | POA: Diagnosis not present

## 2016-03-17 DIAGNOSIS — I359 Nonrheumatic aortic valve disorder, unspecified: Secondary | ICD-10-CM | POA: Diagnosis not present

## 2016-03-23 DIAGNOSIS — D510 Vitamin B12 deficiency anemia due to intrinsic factor deficiency: Secondary | ICD-10-CM | POA: Diagnosis not present

## 2016-04-28 DIAGNOSIS — D510 Vitamin B12 deficiency anemia due to intrinsic factor deficiency: Secondary | ICD-10-CM | POA: Diagnosis not present

## 2016-04-28 DIAGNOSIS — I10 Essential (primary) hypertension: Secondary | ICD-10-CM | POA: Diagnosis not present

## 2016-04-28 DIAGNOSIS — I480 Paroxysmal atrial fibrillation: Secondary | ICD-10-CM | POA: Diagnosis not present

## 2016-04-28 DIAGNOSIS — Z7901 Long term (current) use of anticoagulants: Secondary | ICD-10-CM | POA: Diagnosis not present

## 2016-04-28 DIAGNOSIS — Z950 Presence of cardiac pacemaker: Secondary | ICD-10-CM | POA: Diagnosis not present

## 2016-04-28 DIAGNOSIS — I359 Nonrheumatic aortic valve disorder, unspecified: Secondary | ICD-10-CM | POA: Diagnosis not present

## 2016-04-28 DIAGNOSIS — I2510 Atherosclerotic heart disease of native coronary artery without angina pectoris: Secondary | ICD-10-CM | POA: Diagnosis not present

## 2016-04-28 DIAGNOSIS — E785 Hyperlipidemia, unspecified: Secondary | ICD-10-CM | POA: Diagnosis not present

## 2016-06-02 DIAGNOSIS — Z7901 Long term (current) use of anticoagulants: Secondary | ICD-10-CM | POA: Diagnosis not present

## 2016-06-02 DIAGNOSIS — D510 Vitamin B12 deficiency anemia due to intrinsic factor deficiency: Secondary | ICD-10-CM | POA: Diagnosis not present

## 2016-06-30 DIAGNOSIS — D510 Vitamin B12 deficiency anemia due to intrinsic factor deficiency: Secondary | ICD-10-CM | POA: Diagnosis not present

## 2016-07-28 DIAGNOSIS — Z950 Presence of cardiac pacemaker: Secondary | ICD-10-CM | POA: Diagnosis not present

## 2016-07-28 DIAGNOSIS — E785 Hyperlipidemia, unspecified: Secondary | ICD-10-CM | POA: Diagnosis not present

## 2016-07-28 DIAGNOSIS — I10 Essential (primary) hypertension: Secondary | ICD-10-CM | POA: Diagnosis not present

## 2016-07-28 DIAGNOSIS — I359 Nonrheumatic aortic valve disorder, unspecified: Secondary | ICD-10-CM | POA: Diagnosis not present

## 2016-07-28 DIAGNOSIS — Z7901 Long term (current) use of anticoagulants: Secondary | ICD-10-CM | POA: Diagnosis not present

## 2016-07-28 DIAGNOSIS — I480 Paroxysmal atrial fibrillation: Secondary | ICD-10-CM | POA: Diagnosis not present

## 2016-07-28 DIAGNOSIS — I2510 Atherosclerotic heart disease of native coronary artery without angina pectoris: Secondary | ICD-10-CM | POA: Diagnosis not present

## 2016-07-29 DIAGNOSIS — I2510 Atherosclerotic heart disease of native coronary artery without angina pectoris: Secondary | ICD-10-CM | POA: Diagnosis not present

## 2016-07-29 DIAGNOSIS — I10 Essential (primary) hypertension: Secondary | ICD-10-CM | POA: Diagnosis not present

## 2016-07-29 DIAGNOSIS — Z950 Presence of cardiac pacemaker: Secondary | ICD-10-CM | POA: Diagnosis not present

## 2016-07-29 DIAGNOSIS — I359 Nonrheumatic aortic valve disorder, unspecified: Secondary | ICD-10-CM | POA: Diagnosis not present

## 2016-07-29 DIAGNOSIS — I480 Paroxysmal atrial fibrillation: Secondary | ICD-10-CM | POA: Diagnosis not present

## 2016-07-29 DIAGNOSIS — E785 Hyperlipidemia, unspecified: Secondary | ICD-10-CM | POA: Diagnosis not present

## 2016-08-04 DIAGNOSIS — D510 Vitamin B12 deficiency anemia due to intrinsic factor deficiency: Secondary | ICD-10-CM | POA: Diagnosis not present

## 2016-09-01 DIAGNOSIS — Z7901 Long term (current) use of anticoagulants: Secondary | ICD-10-CM | POA: Diagnosis not present

## 2016-09-01 DIAGNOSIS — D510 Vitamin B12 deficiency anemia due to intrinsic factor deficiency: Secondary | ICD-10-CM | POA: Diagnosis not present

## 2016-10-06 DIAGNOSIS — D510 Vitamin B12 deficiency anemia due to intrinsic factor deficiency: Secondary | ICD-10-CM | POA: Diagnosis not present

## 2016-10-31 DIAGNOSIS — I10 Essential (primary) hypertension: Secondary | ICD-10-CM | POA: Diagnosis not present

## 2016-10-31 DIAGNOSIS — Z950 Presence of cardiac pacemaker: Secondary | ICD-10-CM | POA: Diagnosis not present

## 2016-10-31 DIAGNOSIS — I2510 Atherosclerotic heart disease of native coronary artery without angina pectoris: Secondary | ICD-10-CM | POA: Diagnosis not present

## 2016-10-31 DIAGNOSIS — I359 Nonrheumatic aortic valve disorder, unspecified: Secondary | ICD-10-CM | POA: Diagnosis not present

## 2016-10-31 DIAGNOSIS — I480 Paroxysmal atrial fibrillation: Secondary | ICD-10-CM | POA: Diagnosis not present

## 2016-10-31 DIAGNOSIS — E785 Hyperlipidemia, unspecified: Secondary | ICD-10-CM | POA: Diagnosis not present

## 2016-11-03 DIAGNOSIS — D510 Vitamin B12 deficiency anemia due to intrinsic factor deficiency: Secondary | ICD-10-CM | POA: Diagnosis not present

## 2016-11-28 ENCOUNTER — Other Ambulatory Visit: Payer: Self-pay

## 2016-12-08 DIAGNOSIS — D510 Vitamin B12 deficiency anemia due to intrinsic factor deficiency: Secondary | ICD-10-CM | POA: Diagnosis not present

## 2017-01-30 DIAGNOSIS — I10 Essential (primary) hypertension: Secondary | ICD-10-CM | POA: Diagnosis not present

## 2017-01-30 DIAGNOSIS — I359 Nonrheumatic aortic valve disorder, unspecified: Secondary | ICD-10-CM | POA: Diagnosis not present

## 2017-01-30 DIAGNOSIS — E785 Hyperlipidemia, unspecified: Secondary | ICD-10-CM | POA: Diagnosis not present

## 2017-01-30 DIAGNOSIS — Z950 Presence of cardiac pacemaker: Secondary | ICD-10-CM | POA: Diagnosis not present

## 2017-01-30 DIAGNOSIS — I480 Paroxysmal atrial fibrillation: Secondary | ICD-10-CM | POA: Diagnosis not present

## 2017-01-30 DIAGNOSIS — I2510 Atherosclerotic heart disease of native coronary artery without angina pectoris: Secondary | ICD-10-CM | POA: Diagnosis not present

## 2017-04-20 DIAGNOSIS — L570 Actinic keratosis: Secondary | ICD-10-CM | POA: Diagnosis not present

## 2017-04-20 DIAGNOSIS — C44629 Squamous cell carcinoma of skin of left upper limb, including shoulder: Secondary | ICD-10-CM | POA: Diagnosis not present

## 2017-04-20 DIAGNOSIS — D225 Melanocytic nevi of trunk: Secondary | ICD-10-CM | POA: Diagnosis not present

## 2017-04-20 DIAGNOSIS — C44329 Squamous cell carcinoma of skin of other parts of face: Secondary | ICD-10-CM | POA: Diagnosis not present

## 2017-05-02 DIAGNOSIS — I359 Nonrheumatic aortic valve disorder, unspecified: Secondary | ICD-10-CM | POA: Diagnosis not present

## 2017-05-02 DIAGNOSIS — I2510 Atherosclerotic heart disease of native coronary artery without angina pectoris: Secondary | ICD-10-CM | POA: Diagnosis not present

## 2017-05-02 DIAGNOSIS — I10 Essential (primary) hypertension: Secondary | ICD-10-CM | POA: Diagnosis not present

## 2017-05-02 DIAGNOSIS — E785 Hyperlipidemia, unspecified: Secondary | ICD-10-CM | POA: Diagnosis not present

## 2017-05-02 DIAGNOSIS — I480 Paroxysmal atrial fibrillation: Secondary | ICD-10-CM | POA: Diagnosis not present

## 2017-05-02 DIAGNOSIS — Z950 Presence of cardiac pacemaker: Secondary | ICD-10-CM | POA: Diagnosis not present

## 2017-05-29 DIAGNOSIS — I10 Essential (primary) hypertension: Secondary | ICD-10-CM | POA: Diagnosis not present

## 2017-05-29 DIAGNOSIS — Z1331 Encounter for screening for depression: Secondary | ICD-10-CM | POA: Diagnosis not present

## 2017-05-29 DIAGNOSIS — Z1339 Encounter for screening examination for other mental health and behavioral disorders: Secondary | ICD-10-CM | POA: Diagnosis not present

## 2017-05-29 DIAGNOSIS — Z0000 Encounter for general adult medical examination without abnormal findings: Secondary | ICD-10-CM | POA: Diagnosis not present

## 2017-05-29 DIAGNOSIS — E559 Vitamin D deficiency, unspecified: Secondary | ICD-10-CM | POA: Diagnosis not present

## 2017-05-29 DIAGNOSIS — R5383 Other fatigue: Secondary | ICD-10-CM | POA: Diagnosis not present

## 2017-05-29 DIAGNOSIS — Z23 Encounter for immunization: Secondary | ICD-10-CM | POA: Diagnosis not present

## 2017-05-29 DIAGNOSIS — Z9181 History of falling: Secondary | ICD-10-CM | POA: Diagnosis not present

## 2017-05-29 DIAGNOSIS — D510 Vitamin B12 deficiency anemia due to intrinsic factor deficiency: Secondary | ICD-10-CM | POA: Diagnosis not present

## 2017-06-26 DIAGNOSIS — N401 Enlarged prostate with lower urinary tract symptoms: Secondary | ICD-10-CM | POA: Diagnosis not present

## 2017-06-26 DIAGNOSIS — N200 Calculus of kidney: Secondary | ICD-10-CM | POA: Diagnosis not present

## 2017-06-26 DIAGNOSIS — N210 Calculus in bladder: Secondary | ICD-10-CM | POA: Diagnosis not present

## 2017-06-27 DIAGNOSIS — N210 Calculus in bladder: Secondary | ICD-10-CM | POA: Diagnosis not present

## 2017-06-27 DIAGNOSIS — N200 Calculus of kidney: Secondary | ICD-10-CM | POA: Diagnosis not present

## 2017-06-29 DIAGNOSIS — H353134 Nonexudative age-related macular degeneration, bilateral, advanced atrophic with subfoveal involvement: Secondary | ICD-10-CM | POA: Diagnosis not present

## 2017-07-05 DIAGNOSIS — N210 Calculus in bladder: Secondary | ICD-10-CM | POA: Diagnosis not present

## 2017-07-05 DIAGNOSIS — N401 Enlarged prostate with lower urinary tract symptoms: Secondary | ICD-10-CM | POA: Diagnosis not present

## 2017-07-27 DIAGNOSIS — E785 Hyperlipidemia, unspecified: Secondary | ICD-10-CM | POA: Diagnosis not present

## 2017-07-27 DIAGNOSIS — I2510 Atherosclerotic heart disease of native coronary artery without angina pectoris: Secondary | ICD-10-CM | POA: Diagnosis not present

## 2017-07-27 DIAGNOSIS — I10 Essential (primary) hypertension: Secondary | ICD-10-CM | POA: Diagnosis not present

## 2017-07-27 DIAGNOSIS — Z950 Presence of cardiac pacemaker: Secondary | ICD-10-CM | POA: Diagnosis not present

## 2017-07-27 DIAGNOSIS — I359 Nonrheumatic aortic valve disorder, unspecified: Secondary | ICD-10-CM | POA: Diagnosis not present

## 2017-07-27 DIAGNOSIS — I480 Paroxysmal atrial fibrillation: Secondary | ICD-10-CM | POA: Diagnosis not present

## 2017-08-29 DIAGNOSIS — Z6822 Body mass index (BMI) 22.0-22.9, adult: Secondary | ICD-10-CM | POA: Diagnosis not present

## 2017-08-29 DIAGNOSIS — E559 Vitamin D deficiency, unspecified: Secondary | ICD-10-CM | POA: Diagnosis not present

## 2017-08-29 DIAGNOSIS — L259 Unspecified contact dermatitis, unspecified cause: Secondary | ICD-10-CM | POA: Diagnosis not present

## 2017-08-29 DIAGNOSIS — I10 Essential (primary) hypertension: Secondary | ICD-10-CM | POA: Diagnosis not present

## 2017-08-31 DIAGNOSIS — I2510 Atherosclerotic heart disease of native coronary artery without angina pectoris: Secondary | ICD-10-CM | POA: Diagnosis not present

## 2017-08-31 DIAGNOSIS — Z950 Presence of cardiac pacemaker: Secondary | ICD-10-CM | POA: Diagnosis not present

## 2017-08-31 DIAGNOSIS — I10 Essential (primary) hypertension: Secondary | ICD-10-CM | POA: Diagnosis not present

## 2017-08-31 DIAGNOSIS — I359 Nonrheumatic aortic valve disorder, unspecified: Secondary | ICD-10-CM | POA: Diagnosis not present

## 2017-08-31 DIAGNOSIS — E785 Hyperlipidemia, unspecified: Secondary | ICD-10-CM | POA: Diagnosis not present

## 2017-08-31 DIAGNOSIS — I480 Paroxysmal atrial fibrillation: Secondary | ICD-10-CM | POA: Diagnosis not present

## 2017-09-19 DIAGNOSIS — Z79899 Other long term (current) drug therapy: Secondary | ICD-10-CM | POA: Diagnosis not present

## 2017-09-19 DIAGNOSIS — S299XXA Unspecified injury of thorax, initial encounter: Secondary | ICD-10-CM | POA: Diagnosis not present

## 2017-09-19 DIAGNOSIS — Z87891 Personal history of nicotine dependence: Secondary | ICD-10-CM | POA: Diagnosis not present

## 2017-09-19 DIAGNOSIS — I482 Chronic atrial fibrillation: Secondary | ICD-10-CM | POA: Diagnosis not present

## 2017-09-19 DIAGNOSIS — S3993XA Unspecified injury of pelvis, initial encounter: Secondary | ICD-10-CM | POA: Diagnosis not present

## 2017-09-19 DIAGNOSIS — W19XXXA Unspecified fall, initial encounter: Secondary | ICD-10-CM | POA: Diagnosis not present

## 2017-09-19 DIAGNOSIS — M79651 Pain in right thigh: Secondary | ICD-10-CM | POA: Diagnosis not present

## 2017-09-19 DIAGNOSIS — M79604 Pain in right leg: Secondary | ICD-10-CM | POA: Diagnosis not present

## 2017-09-19 DIAGNOSIS — S8991XA Unspecified injury of right lower leg, initial encounter: Secondary | ICD-10-CM | POA: Diagnosis not present

## 2017-09-19 DIAGNOSIS — R262 Difficulty in walking, not elsewhere classified: Secondary | ICD-10-CM | POA: Diagnosis not present

## 2017-09-19 DIAGNOSIS — R17 Unspecified jaundice: Secondary | ICD-10-CM | POA: Diagnosis not present

## 2017-09-19 DIAGNOSIS — I10 Essential (primary) hypertension: Secondary | ICD-10-CM | POA: Diagnosis not present

## 2017-09-19 DIAGNOSIS — J9601 Acute respiratory failure with hypoxia: Secondary | ICD-10-CM | POA: Diagnosis not present

## 2017-09-19 DIAGNOSIS — Z950 Presence of cardiac pacemaker: Secondary | ICD-10-CM | POA: Diagnosis not present

## 2017-09-19 DIAGNOSIS — S79921A Unspecified injury of right thigh, initial encounter: Secondary | ICD-10-CM | POA: Diagnosis not present

## 2017-09-19 DIAGNOSIS — S79912A Unspecified injury of left hip, initial encounter: Secondary | ICD-10-CM | POA: Diagnosis not present

## 2017-09-19 DIAGNOSIS — M25552 Pain in left hip: Secondary | ICD-10-CM | POA: Diagnosis not present

## 2017-09-19 DIAGNOSIS — M25561 Pain in right knee: Secondary | ICD-10-CM | POA: Diagnosis not present

## 2017-09-19 DIAGNOSIS — S79911A Unspecified injury of right hip, initial encounter: Secondary | ICD-10-CM | POA: Diagnosis not present

## 2017-09-19 DIAGNOSIS — M25551 Pain in right hip: Secondary | ICD-10-CM | POA: Diagnosis not present

## 2017-09-19 DIAGNOSIS — T148XXA Other injury of unspecified body region, initial encounter: Secondary | ICD-10-CM | POA: Diagnosis not present

## 2017-09-20 DIAGNOSIS — W19XXXA Unspecified fall, initial encounter: Secondary | ICD-10-CM | POA: Diagnosis not present

## 2017-09-20 DIAGNOSIS — I10 Essential (primary) hypertension: Secondary | ICD-10-CM | POA: Diagnosis not present

## 2017-09-20 DIAGNOSIS — Z950 Presence of cardiac pacemaker: Secondary | ICD-10-CM | POA: Diagnosis not present

## 2017-09-20 DIAGNOSIS — I482 Chronic atrial fibrillation: Secondary | ICD-10-CM | POA: Diagnosis not present

## 2017-09-20 DIAGNOSIS — R262 Difficulty in walking, not elsewhere classified: Secondary | ICD-10-CM | POA: Diagnosis not present

## 2017-09-20 DIAGNOSIS — M79604 Pain in right leg: Secondary | ICD-10-CM | POA: Diagnosis not present

## 2017-09-20 DIAGNOSIS — R17 Unspecified jaundice: Secondary | ICD-10-CM | POA: Diagnosis not present

## 2017-09-22 DIAGNOSIS — I4891 Unspecified atrial fibrillation: Secondary | ICD-10-CM | POA: Diagnosis not present

## 2017-09-22 DIAGNOSIS — R269 Unspecified abnormalities of gait and mobility: Secondary | ICD-10-CM | POA: Diagnosis not present

## 2017-09-22 DIAGNOSIS — Z6821 Body mass index (BMI) 21.0-21.9, adult: Secondary | ICD-10-CM | POA: Diagnosis not present

## 2017-09-23 DIAGNOSIS — Z950 Presence of cardiac pacemaker: Secondary | ICD-10-CM | POA: Diagnosis not present

## 2017-09-23 DIAGNOSIS — Z9181 History of falling: Secondary | ICD-10-CM | POA: Diagnosis not present

## 2017-09-23 DIAGNOSIS — I482 Chronic atrial fibrillation: Secondary | ICD-10-CM | POA: Diagnosis not present

## 2017-09-23 DIAGNOSIS — Z87891 Personal history of nicotine dependence: Secondary | ICD-10-CM | POA: Diagnosis not present

## 2017-09-23 DIAGNOSIS — D649 Anemia, unspecified: Secondary | ICD-10-CM | POA: Diagnosis not present

## 2017-09-23 DIAGNOSIS — H548 Legal blindness, as defined in USA: Secondary | ICD-10-CM | POA: Diagnosis not present

## 2017-09-23 DIAGNOSIS — M1991 Primary osteoarthritis, unspecified site: Secondary | ICD-10-CM | POA: Diagnosis not present

## 2017-09-23 DIAGNOSIS — I10 Essential (primary) hypertension: Secondary | ICD-10-CM | POA: Diagnosis not present

## 2017-09-24 DIAGNOSIS — D649 Anemia, unspecified: Secondary | ICD-10-CM | POA: Diagnosis not present

## 2017-09-24 DIAGNOSIS — H548 Legal blindness, as defined in USA: Secondary | ICD-10-CM | POA: Diagnosis not present

## 2017-09-24 DIAGNOSIS — I482 Chronic atrial fibrillation: Secondary | ICD-10-CM | POA: Diagnosis not present

## 2017-09-24 DIAGNOSIS — Z9181 History of falling: Secondary | ICD-10-CM | POA: Diagnosis not present

## 2017-09-24 DIAGNOSIS — I10 Essential (primary) hypertension: Secondary | ICD-10-CM | POA: Diagnosis not present

## 2017-09-24 DIAGNOSIS — M1991 Primary osteoarthritis, unspecified site: Secondary | ICD-10-CM | POA: Diagnosis not present

## 2017-09-27 DIAGNOSIS — M1991 Primary osteoarthritis, unspecified site: Secondary | ICD-10-CM | POA: Diagnosis not present

## 2017-09-27 DIAGNOSIS — I10 Essential (primary) hypertension: Secondary | ICD-10-CM | POA: Diagnosis not present

## 2017-09-27 DIAGNOSIS — D649 Anemia, unspecified: Secondary | ICD-10-CM | POA: Diagnosis not present

## 2017-09-27 DIAGNOSIS — H548 Legal blindness, as defined in USA: Secondary | ICD-10-CM | POA: Diagnosis not present

## 2017-09-27 DIAGNOSIS — Z9181 History of falling: Secondary | ICD-10-CM | POA: Diagnosis not present

## 2017-09-27 DIAGNOSIS — I482 Chronic atrial fibrillation: Secondary | ICD-10-CM | POA: Diagnosis not present

## 2017-09-28 DIAGNOSIS — H548 Legal blindness, as defined in USA: Secondary | ICD-10-CM | POA: Diagnosis not present

## 2017-09-28 DIAGNOSIS — M1991 Primary osteoarthritis, unspecified site: Secondary | ICD-10-CM | POA: Diagnosis not present

## 2017-09-28 DIAGNOSIS — Z9181 History of falling: Secondary | ICD-10-CM | POA: Diagnosis not present

## 2017-09-28 DIAGNOSIS — I10 Essential (primary) hypertension: Secondary | ICD-10-CM | POA: Diagnosis not present

## 2017-09-28 DIAGNOSIS — D649 Anemia, unspecified: Secondary | ICD-10-CM | POA: Diagnosis not present

## 2017-09-28 DIAGNOSIS — I482 Chronic atrial fibrillation: Secondary | ICD-10-CM | POA: Diagnosis not present

## 2017-09-29 DIAGNOSIS — H548 Legal blindness, as defined in USA: Secondary | ICD-10-CM | POA: Diagnosis not present

## 2017-09-29 DIAGNOSIS — M1991 Primary osteoarthritis, unspecified site: Secondary | ICD-10-CM | POA: Diagnosis not present

## 2017-09-29 DIAGNOSIS — D649 Anemia, unspecified: Secondary | ICD-10-CM | POA: Diagnosis not present

## 2017-09-29 DIAGNOSIS — I10 Essential (primary) hypertension: Secondary | ICD-10-CM | POA: Diagnosis not present

## 2017-09-29 DIAGNOSIS — Z9181 History of falling: Secondary | ICD-10-CM | POA: Diagnosis not present

## 2017-09-29 DIAGNOSIS — I482 Chronic atrial fibrillation: Secondary | ICD-10-CM | POA: Diagnosis not present

## 2017-10-02 DIAGNOSIS — I482 Chronic atrial fibrillation: Secondary | ICD-10-CM | POA: Diagnosis not present

## 2017-10-02 DIAGNOSIS — Z9181 History of falling: Secondary | ICD-10-CM | POA: Diagnosis not present

## 2017-10-02 DIAGNOSIS — I10 Essential (primary) hypertension: Secondary | ICD-10-CM | POA: Diagnosis not present

## 2017-10-02 DIAGNOSIS — M1991 Primary osteoarthritis, unspecified site: Secondary | ICD-10-CM | POA: Diagnosis not present

## 2017-10-02 DIAGNOSIS — H548 Legal blindness, as defined in USA: Secondary | ICD-10-CM | POA: Diagnosis not present

## 2017-10-02 DIAGNOSIS — D649 Anemia, unspecified: Secondary | ICD-10-CM | POA: Diagnosis not present

## 2017-10-04 DIAGNOSIS — M1711 Unilateral primary osteoarthritis, right knee: Secondary | ICD-10-CM | POA: Diagnosis not present

## 2017-10-04 DIAGNOSIS — M1991 Primary osteoarthritis, unspecified site: Secondary | ICD-10-CM | POA: Diagnosis not present

## 2017-10-04 DIAGNOSIS — H548 Legal blindness, as defined in USA: Secondary | ICD-10-CM | POA: Diagnosis not present

## 2017-10-04 DIAGNOSIS — M11261 Other chondrocalcinosis, right knee: Secondary | ICD-10-CM | POA: Diagnosis not present

## 2017-10-04 DIAGNOSIS — I10 Essential (primary) hypertension: Secondary | ICD-10-CM | POA: Diagnosis not present

## 2017-10-04 DIAGNOSIS — I482 Chronic atrial fibrillation: Secondary | ICD-10-CM | POA: Diagnosis not present

## 2017-10-04 DIAGNOSIS — Z9181 History of falling: Secondary | ICD-10-CM | POA: Diagnosis not present

## 2017-10-04 DIAGNOSIS — D649 Anemia, unspecified: Secondary | ICD-10-CM | POA: Diagnosis not present

## 2017-10-06 DIAGNOSIS — M1991 Primary osteoarthritis, unspecified site: Secondary | ICD-10-CM | POA: Diagnosis not present

## 2017-10-06 DIAGNOSIS — Z9181 History of falling: Secondary | ICD-10-CM | POA: Diagnosis not present

## 2017-10-06 DIAGNOSIS — I10 Essential (primary) hypertension: Secondary | ICD-10-CM | POA: Diagnosis not present

## 2017-10-06 DIAGNOSIS — D649 Anemia, unspecified: Secondary | ICD-10-CM | POA: Diagnosis not present

## 2017-10-06 DIAGNOSIS — H548 Legal blindness, as defined in USA: Secondary | ICD-10-CM | POA: Diagnosis not present

## 2017-10-06 DIAGNOSIS — I482 Chronic atrial fibrillation: Secondary | ICD-10-CM | POA: Diagnosis not present

## 2017-10-09 DIAGNOSIS — I482 Chronic atrial fibrillation: Secondary | ICD-10-CM | POA: Diagnosis not present

## 2017-10-10 DIAGNOSIS — D649 Anemia, unspecified: Secondary | ICD-10-CM | POA: Diagnosis not present

## 2017-10-10 DIAGNOSIS — H548 Legal blindness, as defined in USA: Secondary | ICD-10-CM | POA: Diagnosis not present

## 2017-10-10 DIAGNOSIS — Z9181 History of falling: Secondary | ICD-10-CM | POA: Diagnosis not present

## 2017-10-10 DIAGNOSIS — M1991 Primary osteoarthritis, unspecified site: Secondary | ICD-10-CM | POA: Diagnosis not present

## 2017-10-10 DIAGNOSIS — I10 Essential (primary) hypertension: Secondary | ICD-10-CM | POA: Diagnosis not present

## 2017-10-10 DIAGNOSIS — I482 Chronic atrial fibrillation: Secondary | ICD-10-CM | POA: Diagnosis not present

## 2017-10-11 DIAGNOSIS — Z471 Aftercare following joint replacement surgery: Secondary | ICD-10-CM | POA: Diagnosis not present

## 2017-10-11 DIAGNOSIS — M84651A Pathological fracture in other disease, right femur, initial encounter for fracture: Secondary | ICD-10-CM | POA: Diagnosis not present

## 2017-10-11 DIAGNOSIS — Z681 Body mass index (BMI) 19 or less, adult: Secondary | ICD-10-CM | POA: Diagnosis not present

## 2017-10-11 DIAGNOSIS — Z720 Tobacco use: Secondary | ICD-10-CM | POA: Diagnosis not present

## 2017-10-11 DIAGNOSIS — M810 Age-related osteoporosis without current pathological fracture: Secondary | ICD-10-CM | POA: Diagnosis not present

## 2017-10-11 DIAGNOSIS — R9401 Abnormal electroencephalogram [EEG]: Secondary | ICD-10-CM | POA: Diagnosis not present

## 2017-10-11 DIAGNOSIS — S72001D Fracture of unspecified part of neck of right femur, subsequent encounter for closed fracture with routine healing: Secondary | ICD-10-CM | POA: Diagnosis not present

## 2017-10-11 DIAGNOSIS — R17 Unspecified jaundice: Secondary | ICD-10-CM | POA: Diagnosis not present

## 2017-10-11 DIAGNOSIS — M25561 Pain in right knee: Secondary | ICD-10-CM | POA: Diagnosis not present

## 2017-10-11 DIAGNOSIS — Z7401 Bed confinement status: Secondary | ICD-10-CM | POA: Diagnosis not present

## 2017-10-11 DIAGNOSIS — S72011A Unspecified intracapsular fracture of right femur, initial encounter for closed fracture: Secondary | ICD-10-CM | POA: Diagnosis not present

## 2017-10-11 DIAGNOSIS — S7290XA Unspecified fracture of unspecified femur, initial encounter for closed fracture: Secondary | ICD-10-CM | POA: Diagnosis not present

## 2017-10-11 DIAGNOSIS — Z87891 Personal history of nicotine dependence: Secondary | ICD-10-CM | POA: Diagnosis not present

## 2017-10-11 DIAGNOSIS — I482 Chronic atrial fibrillation: Secondary | ICD-10-CM | POA: Diagnosis not present

## 2017-10-11 DIAGNOSIS — R012 Other cardiac sounds: Secondary | ICD-10-CM | POA: Diagnosis present

## 2017-10-11 DIAGNOSIS — M1611 Unilateral primary osteoarthritis, right hip: Secondary | ICD-10-CM | POA: Diagnosis not present

## 2017-10-11 DIAGNOSIS — I10 Essential (primary) hypertension: Secondary | ICD-10-CM | POA: Diagnosis not present

## 2017-10-11 DIAGNOSIS — M25551 Pain in right hip: Secondary | ICD-10-CM | POA: Diagnosis not present

## 2017-10-11 DIAGNOSIS — M2550 Pain in unspecified joint: Secondary | ICD-10-CM | POA: Diagnosis not present

## 2017-10-11 DIAGNOSIS — E440 Moderate protein-calorie malnutrition: Secondary | ICD-10-CM | POA: Diagnosis not present

## 2017-10-11 DIAGNOSIS — M79604 Pain in right leg: Secondary | ICD-10-CM | POA: Diagnosis not present

## 2017-10-11 DIAGNOSIS — Z79899 Other long term (current) drug therapy: Secondary | ICD-10-CM | POA: Diagnosis not present

## 2017-10-11 DIAGNOSIS — M79651 Pain in right thigh: Secondary | ICD-10-CM | POA: Diagnosis not present

## 2017-10-11 DIAGNOSIS — S79911A Unspecified injury of right hip, initial encounter: Secondary | ICD-10-CM | POA: Diagnosis not present

## 2017-10-11 DIAGNOSIS — S8991XA Unspecified injury of right lower leg, initial encounter: Secondary | ICD-10-CM | POA: Diagnosis not present

## 2017-10-11 DIAGNOSIS — M1990 Unspecified osteoarthritis, unspecified site: Secondary | ICD-10-CM | POA: Diagnosis present

## 2017-10-11 DIAGNOSIS — M80051A Age-related osteoporosis with current pathological fracture, right femur, initial encounter for fracture: Secondary | ICD-10-CM | POA: Diagnosis not present

## 2017-10-11 DIAGNOSIS — Z96641 Presence of right artificial hip joint: Secondary | ICD-10-CM | POA: Diagnosis not present

## 2017-10-11 DIAGNOSIS — S72001A Fracture of unspecified part of neck of right femur, initial encounter for closed fracture: Secondary | ICD-10-CM | POA: Diagnosis not present

## 2017-10-11 DIAGNOSIS — Z950 Presence of cardiac pacemaker: Secondary | ICD-10-CM | POA: Diagnosis not present

## 2017-10-12 DIAGNOSIS — M80051A Age-related osteoporosis with current pathological fracture, right femur, initial encounter for fracture: Secondary | ICD-10-CM | POA: Diagnosis not present

## 2017-10-15 DIAGNOSIS — D649 Anemia, unspecified: Secondary | ICD-10-CM | POA: Diagnosis not present

## 2017-10-15 DIAGNOSIS — Z720 Tobacco use: Secondary | ICD-10-CM | POA: Diagnosis not present

## 2017-10-15 DIAGNOSIS — I10 Essential (primary) hypertension: Secondary | ICD-10-CM | POA: Diagnosis not present

## 2017-10-15 DIAGNOSIS — Z950 Presence of cardiac pacemaker: Secondary | ICD-10-CM | POA: Diagnosis not present

## 2017-10-15 DIAGNOSIS — R9401 Abnormal electroencephalogram [EEG]: Secondary | ICD-10-CM | POA: Diagnosis not present

## 2017-10-15 DIAGNOSIS — S72001A Fracture of unspecified part of neck of right femur, initial encounter for closed fracture: Secondary | ICD-10-CM | POA: Diagnosis not present

## 2017-10-15 DIAGNOSIS — E785 Hyperlipidemia, unspecified: Secondary | ICD-10-CM | POA: Diagnosis not present

## 2017-10-15 DIAGNOSIS — I359 Nonrheumatic aortic valve disorder, unspecified: Secondary | ICD-10-CM | POA: Diagnosis not present

## 2017-10-15 DIAGNOSIS — S72001D Fracture of unspecified part of neck of right femur, subsequent encounter for closed fracture with routine healing: Secondary | ICD-10-CM | POA: Diagnosis not present

## 2017-10-15 DIAGNOSIS — I482 Chronic atrial fibrillation: Secondary | ICD-10-CM | POA: Diagnosis not present

## 2017-10-15 DIAGNOSIS — M2550 Pain in unspecified joint: Secondary | ICD-10-CM | POA: Diagnosis not present

## 2017-10-15 DIAGNOSIS — I480 Paroxysmal atrial fibrillation: Secondary | ICD-10-CM | POA: Diagnosis not present

## 2017-10-15 DIAGNOSIS — G8918 Other acute postprocedural pain: Secondary | ICD-10-CM | POA: Diagnosis not present

## 2017-10-15 DIAGNOSIS — I2510 Atherosclerotic heart disease of native coronary artery without angina pectoris: Secondary | ICD-10-CM | POA: Diagnosis not present

## 2017-10-15 DIAGNOSIS — Z7401 Bed confinement status: Secondary | ICD-10-CM | POA: Diagnosis not present

## 2017-10-15 DIAGNOSIS — R262 Difficulty in walking, not elsewhere classified: Secondary | ICD-10-CM | POA: Diagnosis not present

## 2017-10-26 DIAGNOSIS — E785 Hyperlipidemia, unspecified: Secondary | ICD-10-CM | POA: Diagnosis not present

## 2017-10-26 DIAGNOSIS — I10 Essential (primary) hypertension: Secondary | ICD-10-CM | POA: Diagnosis not present

## 2017-10-26 DIAGNOSIS — Z950 Presence of cardiac pacemaker: Secondary | ICD-10-CM | POA: Diagnosis not present

## 2017-10-26 DIAGNOSIS — I480 Paroxysmal atrial fibrillation: Secondary | ICD-10-CM | POA: Diagnosis not present

## 2017-10-26 DIAGNOSIS — I359 Nonrheumatic aortic valve disorder, unspecified: Secondary | ICD-10-CM | POA: Diagnosis not present

## 2017-10-26 DIAGNOSIS — I2510 Atherosclerotic heart disease of native coronary artery without angina pectoris: Secondary | ICD-10-CM | POA: Diagnosis not present

## 2017-10-29 DIAGNOSIS — D649 Anemia, unspecified: Secondary | ICD-10-CM | POA: Diagnosis not present

## 2017-10-29 DIAGNOSIS — G8918 Other acute postprocedural pain: Secondary | ICD-10-CM | POA: Diagnosis not present

## 2017-10-29 DIAGNOSIS — R262 Difficulty in walking, not elsewhere classified: Secondary | ICD-10-CM | POA: Diagnosis not present

## 2017-10-29 DIAGNOSIS — S72001D Fracture of unspecified part of neck of right femur, subsequent encounter for closed fracture with routine healing: Secondary | ICD-10-CM | POA: Diagnosis not present

## 2017-11-03 DIAGNOSIS — D649 Anemia, unspecified: Secondary | ICD-10-CM | POA: Diagnosis not present

## 2017-11-03 DIAGNOSIS — I482 Chronic atrial fibrillation: Secondary | ICD-10-CM | POA: Diagnosis not present

## 2017-11-03 DIAGNOSIS — I10 Essential (primary) hypertension: Secondary | ICD-10-CM | POA: Diagnosis not present

## 2017-11-03 DIAGNOSIS — M1991 Primary osteoarthritis, unspecified site: Secondary | ICD-10-CM | POA: Diagnosis not present

## 2017-11-03 DIAGNOSIS — Z9181 History of falling: Secondary | ICD-10-CM | POA: Diagnosis not present

## 2017-11-03 DIAGNOSIS — H548 Legal blindness, as defined in USA: Secondary | ICD-10-CM | POA: Diagnosis not present

## 2017-11-05 DIAGNOSIS — I482 Chronic atrial fibrillation: Secondary | ICD-10-CM | POA: Diagnosis not present

## 2017-11-05 DIAGNOSIS — Z9181 History of falling: Secondary | ICD-10-CM | POA: Diagnosis not present

## 2017-11-05 DIAGNOSIS — I10 Essential (primary) hypertension: Secondary | ICD-10-CM | POA: Diagnosis not present

## 2017-11-05 DIAGNOSIS — M1991 Primary osteoarthritis, unspecified site: Secondary | ICD-10-CM | POA: Diagnosis not present

## 2017-11-05 DIAGNOSIS — D649 Anemia, unspecified: Secondary | ICD-10-CM | POA: Diagnosis not present

## 2017-11-05 DIAGNOSIS — H548 Legal blindness, as defined in USA: Secondary | ICD-10-CM | POA: Diagnosis not present

## 2017-11-07 DIAGNOSIS — R630 Anorexia: Secondary | ICD-10-CM | POA: Diagnosis not present

## 2017-11-07 DIAGNOSIS — I10 Essential (primary) hypertension: Secondary | ICD-10-CM | POA: Diagnosis not present

## 2017-11-07 DIAGNOSIS — M1990 Unspecified osteoarthritis, unspecified site: Secondary | ICD-10-CM | POA: Diagnosis not present

## 2017-11-07 DIAGNOSIS — Z681 Body mass index (BMI) 19 or less, adult: Secondary | ICD-10-CM | POA: Diagnosis not present

## 2017-11-08 DIAGNOSIS — H548 Legal blindness, as defined in USA: Secondary | ICD-10-CM | POA: Diagnosis not present

## 2017-11-08 DIAGNOSIS — I482 Chronic atrial fibrillation: Secondary | ICD-10-CM | POA: Diagnosis not present

## 2017-11-08 DIAGNOSIS — M1991 Primary osteoarthritis, unspecified site: Secondary | ICD-10-CM | POA: Diagnosis not present

## 2017-11-08 DIAGNOSIS — Z9181 History of falling: Secondary | ICD-10-CM | POA: Diagnosis not present

## 2017-11-08 DIAGNOSIS — I10 Essential (primary) hypertension: Secondary | ICD-10-CM | POA: Diagnosis not present

## 2017-11-08 DIAGNOSIS — D649 Anemia, unspecified: Secondary | ICD-10-CM | POA: Diagnosis not present

## 2017-11-09 DIAGNOSIS — Z9181 History of falling: Secondary | ICD-10-CM | POA: Diagnosis not present

## 2017-11-09 DIAGNOSIS — I482 Chronic atrial fibrillation: Secondary | ICD-10-CM | POA: Diagnosis not present

## 2017-11-09 DIAGNOSIS — I10 Essential (primary) hypertension: Secondary | ICD-10-CM | POA: Diagnosis not present

## 2017-11-09 DIAGNOSIS — M1991 Primary osteoarthritis, unspecified site: Secondary | ICD-10-CM | POA: Diagnosis not present

## 2017-11-09 DIAGNOSIS — H548 Legal blindness, as defined in USA: Secondary | ICD-10-CM | POA: Diagnosis not present

## 2017-11-09 DIAGNOSIS — D649 Anemia, unspecified: Secondary | ICD-10-CM | POA: Diagnosis not present

## 2017-11-10 DIAGNOSIS — H548 Legal blindness, as defined in USA: Secondary | ICD-10-CM | POA: Diagnosis not present

## 2017-11-10 DIAGNOSIS — I482 Chronic atrial fibrillation: Secondary | ICD-10-CM | POA: Diagnosis not present

## 2017-11-10 DIAGNOSIS — D649 Anemia, unspecified: Secondary | ICD-10-CM | POA: Diagnosis not present

## 2017-11-10 DIAGNOSIS — M1991 Primary osteoarthritis, unspecified site: Secondary | ICD-10-CM | POA: Diagnosis not present

## 2017-11-10 DIAGNOSIS — I10 Essential (primary) hypertension: Secondary | ICD-10-CM | POA: Diagnosis not present

## 2017-11-10 DIAGNOSIS — Z9181 History of falling: Secondary | ICD-10-CM | POA: Diagnosis not present

## 2017-11-12 DIAGNOSIS — I10 Essential (primary) hypertension: Secondary | ICD-10-CM | POA: Diagnosis not present

## 2017-11-12 DIAGNOSIS — H548 Legal blindness, as defined in USA: Secondary | ICD-10-CM | POA: Diagnosis not present

## 2017-11-12 DIAGNOSIS — M1991 Primary osteoarthritis, unspecified site: Secondary | ICD-10-CM | POA: Diagnosis not present

## 2017-11-12 DIAGNOSIS — Z9181 History of falling: Secondary | ICD-10-CM | POA: Diagnosis not present

## 2017-11-12 DIAGNOSIS — I482 Chronic atrial fibrillation: Secondary | ICD-10-CM | POA: Diagnosis not present

## 2017-11-12 DIAGNOSIS — D649 Anemia, unspecified: Secondary | ICD-10-CM | POA: Diagnosis not present

## 2017-11-15 DIAGNOSIS — D649 Anemia, unspecified: Secondary | ICD-10-CM | POA: Diagnosis not present

## 2017-11-15 DIAGNOSIS — H548 Legal blindness, as defined in USA: Secondary | ICD-10-CM | POA: Diagnosis not present

## 2017-11-15 DIAGNOSIS — I10 Essential (primary) hypertension: Secondary | ICD-10-CM | POA: Diagnosis not present

## 2017-11-15 DIAGNOSIS — M1991 Primary osteoarthritis, unspecified site: Secondary | ICD-10-CM | POA: Diagnosis not present

## 2017-11-15 DIAGNOSIS — I482 Chronic atrial fibrillation: Secondary | ICD-10-CM | POA: Diagnosis not present

## 2017-11-15 DIAGNOSIS — Z9181 History of falling: Secondary | ICD-10-CM | POA: Diagnosis not present

## 2017-11-16 DIAGNOSIS — I482 Chronic atrial fibrillation: Secondary | ICD-10-CM | POA: Diagnosis not present

## 2017-11-16 DIAGNOSIS — Z9181 History of falling: Secondary | ICD-10-CM | POA: Diagnosis not present

## 2017-11-16 DIAGNOSIS — D649 Anemia, unspecified: Secondary | ICD-10-CM | POA: Diagnosis not present

## 2017-11-16 DIAGNOSIS — I10 Essential (primary) hypertension: Secondary | ICD-10-CM | POA: Diagnosis not present

## 2017-11-16 DIAGNOSIS — M1991 Primary osteoarthritis, unspecified site: Secondary | ICD-10-CM | POA: Diagnosis not present

## 2017-11-16 DIAGNOSIS — H548 Legal blindness, as defined in USA: Secondary | ICD-10-CM | POA: Diagnosis not present

## 2017-11-17 DIAGNOSIS — H548 Legal blindness, as defined in USA: Secondary | ICD-10-CM | POA: Diagnosis not present

## 2017-11-17 DIAGNOSIS — I10 Essential (primary) hypertension: Secondary | ICD-10-CM | POA: Diagnosis not present

## 2017-11-17 DIAGNOSIS — D649 Anemia, unspecified: Secondary | ICD-10-CM | POA: Diagnosis not present

## 2017-11-17 DIAGNOSIS — I482 Chronic atrial fibrillation: Secondary | ICD-10-CM | POA: Diagnosis not present

## 2017-11-17 DIAGNOSIS — M1991 Primary osteoarthritis, unspecified site: Secondary | ICD-10-CM | POA: Diagnosis not present

## 2017-11-17 DIAGNOSIS — Z9181 History of falling: Secondary | ICD-10-CM | POA: Diagnosis not present

## 2017-11-20 DIAGNOSIS — M1991 Primary osteoarthritis, unspecified site: Secondary | ICD-10-CM | POA: Diagnosis not present

## 2017-11-20 DIAGNOSIS — H548 Legal blindness, as defined in USA: Secondary | ICD-10-CM | POA: Diagnosis not present

## 2017-11-20 DIAGNOSIS — I10 Essential (primary) hypertension: Secondary | ICD-10-CM | POA: Diagnosis not present

## 2017-11-20 DIAGNOSIS — Z9181 History of falling: Secondary | ICD-10-CM | POA: Diagnosis not present

## 2017-11-20 DIAGNOSIS — D649 Anemia, unspecified: Secondary | ICD-10-CM | POA: Diagnosis not present

## 2017-11-20 DIAGNOSIS — I482 Chronic atrial fibrillation: Secondary | ICD-10-CM | POA: Diagnosis not present

## 2017-11-20 DIAGNOSIS — S72011A Unspecified intracapsular fracture of right femur, initial encounter for closed fracture: Secondary | ICD-10-CM | POA: Diagnosis not present

## 2017-11-22 DIAGNOSIS — E785 Hyperlipidemia, unspecified: Secondary | ICD-10-CM | POA: Diagnosis not present

## 2017-11-22 DIAGNOSIS — Z950 Presence of cardiac pacemaker: Secondary | ICD-10-CM | POA: Diagnosis not present

## 2017-11-22 DIAGNOSIS — Z9181 History of falling: Secondary | ICD-10-CM | POA: Diagnosis not present

## 2017-11-22 DIAGNOSIS — D649 Anemia, unspecified: Secondary | ICD-10-CM | POA: Diagnosis not present

## 2017-11-22 DIAGNOSIS — M1991 Primary osteoarthritis, unspecified site: Secondary | ICD-10-CM | POA: Diagnosis not present

## 2017-11-22 DIAGNOSIS — H548 Legal blindness, as defined in USA: Secondary | ICD-10-CM | POA: Diagnosis not present

## 2017-11-22 DIAGNOSIS — I482 Chronic atrial fibrillation: Secondary | ICD-10-CM | POA: Diagnosis not present

## 2017-11-22 DIAGNOSIS — S72001D Fracture of unspecified part of neck of right femur, subsequent encounter for closed fracture with routine healing: Secondary | ICD-10-CM | POA: Diagnosis not present

## 2017-11-22 DIAGNOSIS — I10 Essential (primary) hypertension: Secondary | ICD-10-CM | POA: Diagnosis not present

## 2017-11-22 DIAGNOSIS — Z87891 Personal history of nicotine dependence: Secondary | ICD-10-CM | POA: Diagnosis not present

## 2017-11-24 DIAGNOSIS — F329 Major depressive disorder, single episode, unspecified: Secondary | ICD-10-CM | POA: Diagnosis not present

## 2017-11-24 DIAGNOSIS — M810 Age-related osteoporosis without current pathological fracture: Secondary | ICD-10-CM | POA: Diagnosis not present

## 2017-11-24 DIAGNOSIS — Z681 Body mass index (BMI) 19 or less, adult: Secondary | ICD-10-CM | POA: Diagnosis not present

## 2017-11-27 DIAGNOSIS — D649 Anemia, unspecified: Secondary | ICD-10-CM | POA: Diagnosis not present

## 2017-11-27 DIAGNOSIS — I482 Chronic atrial fibrillation: Secondary | ICD-10-CM | POA: Diagnosis not present

## 2017-11-27 DIAGNOSIS — M1991 Primary osteoarthritis, unspecified site: Secondary | ICD-10-CM | POA: Diagnosis not present

## 2017-11-27 DIAGNOSIS — S72001D Fracture of unspecified part of neck of right femur, subsequent encounter for closed fracture with routine healing: Secondary | ICD-10-CM | POA: Diagnosis not present

## 2017-11-27 DIAGNOSIS — H548 Legal blindness, as defined in USA: Secondary | ICD-10-CM | POA: Diagnosis not present

## 2017-11-27 DIAGNOSIS — I10 Essential (primary) hypertension: Secondary | ICD-10-CM | POA: Diagnosis not present

## 2017-11-29 DIAGNOSIS — H548 Legal blindness, as defined in USA: Secondary | ICD-10-CM | POA: Diagnosis not present

## 2017-11-29 DIAGNOSIS — I482 Chronic atrial fibrillation: Secondary | ICD-10-CM | POA: Diagnosis not present

## 2017-11-29 DIAGNOSIS — M810 Age-related osteoporosis without current pathological fracture: Secondary | ICD-10-CM | POA: Diagnosis not present

## 2017-11-29 DIAGNOSIS — S72001D Fracture of unspecified part of neck of right femur, subsequent encounter for closed fracture with routine healing: Secondary | ICD-10-CM | POA: Diagnosis not present

## 2017-11-29 DIAGNOSIS — D649 Anemia, unspecified: Secondary | ICD-10-CM | POA: Diagnosis not present

## 2017-11-29 DIAGNOSIS — E785 Hyperlipidemia, unspecified: Secondary | ICD-10-CM | POA: Diagnosis not present

## 2017-11-29 DIAGNOSIS — I10 Essential (primary) hypertension: Secondary | ICD-10-CM | POA: Diagnosis not present

## 2017-11-29 DIAGNOSIS — M1991 Primary osteoarthritis, unspecified site: Secondary | ICD-10-CM | POA: Diagnosis not present

## 2017-12-04 DIAGNOSIS — I482 Chronic atrial fibrillation: Secondary | ICD-10-CM | POA: Diagnosis not present

## 2017-12-04 DIAGNOSIS — D649 Anemia, unspecified: Secondary | ICD-10-CM | POA: Diagnosis not present

## 2017-12-04 DIAGNOSIS — I10 Essential (primary) hypertension: Secondary | ICD-10-CM | POA: Diagnosis not present

## 2017-12-04 DIAGNOSIS — S72001D Fracture of unspecified part of neck of right femur, subsequent encounter for closed fracture with routine healing: Secondary | ICD-10-CM | POA: Diagnosis not present

## 2017-12-04 DIAGNOSIS — M1991 Primary osteoarthritis, unspecified site: Secondary | ICD-10-CM | POA: Diagnosis not present

## 2017-12-04 DIAGNOSIS — H548 Legal blindness, as defined in USA: Secondary | ICD-10-CM | POA: Diagnosis not present

## 2017-12-07 DIAGNOSIS — D649 Anemia, unspecified: Secondary | ICD-10-CM | POA: Diagnosis not present

## 2017-12-07 DIAGNOSIS — S72001D Fracture of unspecified part of neck of right femur, subsequent encounter for closed fracture with routine healing: Secondary | ICD-10-CM | POA: Diagnosis not present

## 2017-12-07 DIAGNOSIS — I482 Chronic atrial fibrillation: Secondary | ICD-10-CM | POA: Diagnosis not present

## 2017-12-07 DIAGNOSIS — I10 Essential (primary) hypertension: Secondary | ICD-10-CM | POA: Diagnosis not present

## 2017-12-07 DIAGNOSIS — H548 Legal blindness, as defined in USA: Secondary | ICD-10-CM | POA: Diagnosis not present

## 2017-12-07 DIAGNOSIS — M1991 Primary osteoarthritis, unspecified site: Secondary | ICD-10-CM | POA: Diagnosis not present

## 2017-12-08 DIAGNOSIS — M810 Age-related osteoporosis without current pathological fracture: Secondary | ICD-10-CM | POA: Diagnosis not present

## 2017-12-08 DIAGNOSIS — Z681 Body mass index (BMI) 19 or less, adult: Secondary | ICD-10-CM | POA: Diagnosis not present

## 2017-12-08 DIAGNOSIS — F329 Major depressive disorder, single episode, unspecified: Secondary | ICD-10-CM | POA: Diagnosis not present

## 2017-12-11 DIAGNOSIS — H548 Legal blindness, as defined in USA: Secondary | ICD-10-CM | POA: Diagnosis not present

## 2017-12-11 DIAGNOSIS — D649 Anemia, unspecified: Secondary | ICD-10-CM | POA: Diagnosis not present

## 2017-12-11 DIAGNOSIS — I482 Chronic atrial fibrillation: Secondary | ICD-10-CM | POA: Diagnosis not present

## 2017-12-11 DIAGNOSIS — S72001D Fracture of unspecified part of neck of right femur, subsequent encounter for closed fracture with routine healing: Secondary | ICD-10-CM | POA: Diagnosis not present

## 2017-12-11 DIAGNOSIS — I10 Essential (primary) hypertension: Secondary | ICD-10-CM | POA: Diagnosis not present

## 2017-12-11 DIAGNOSIS — M1991 Primary osteoarthritis, unspecified site: Secondary | ICD-10-CM | POA: Diagnosis not present

## 2017-12-11 DIAGNOSIS — E876 Hypokalemia: Secondary | ICD-10-CM | POA: Diagnosis not present

## 2017-12-13 DIAGNOSIS — M1991 Primary osteoarthritis, unspecified site: Secondary | ICD-10-CM | POA: Diagnosis not present

## 2017-12-13 DIAGNOSIS — H548 Legal blindness, as defined in USA: Secondary | ICD-10-CM | POA: Diagnosis not present

## 2017-12-13 DIAGNOSIS — D649 Anemia, unspecified: Secondary | ICD-10-CM | POA: Diagnosis not present

## 2017-12-13 DIAGNOSIS — S72001D Fracture of unspecified part of neck of right femur, subsequent encounter for closed fracture with routine healing: Secondary | ICD-10-CM | POA: Diagnosis not present

## 2017-12-13 DIAGNOSIS — I10 Essential (primary) hypertension: Secondary | ICD-10-CM | POA: Diagnosis not present

## 2017-12-13 DIAGNOSIS — I482 Chronic atrial fibrillation: Secondary | ICD-10-CM | POA: Diagnosis not present

## 2017-12-19 DIAGNOSIS — H548 Legal blindness, as defined in USA: Secondary | ICD-10-CM | POA: Diagnosis not present

## 2017-12-19 DIAGNOSIS — M1991 Primary osteoarthritis, unspecified site: Secondary | ICD-10-CM | POA: Diagnosis not present

## 2017-12-19 DIAGNOSIS — I482 Chronic atrial fibrillation: Secondary | ICD-10-CM | POA: Diagnosis not present

## 2017-12-19 DIAGNOSIS — I10 Essential (primary) hypertension: Secondary | ICD-10-CM | POA: Diagnosis not present

## 2017-12-19 DIAGNOSIS — D649 Anemia, unspecified: Secondary | ICD-10-CM | POA: Diagnosis not present

## 2017-12-19 DIAGNOSIS — S72001D Fracture of unspecified part of neck of right femur, subsequent encounter for closed fracture with routine healing: Secondary | ICD-10-CM | POA: Diagnosis not present

## 2017-12-20 DIAGNOSIS — K5730 Diverticulosis of large intestine without perforation or abscess without bleeding: Secondary | ICD-10-CM | POA: Diagnosis not present

## 2017-12-20 DIAGNOSIS — R634 Abnormal weight loss: Secondary | ICD-10-CM | POA: Diagnosis not present

## 2017-12-20 DIAGNOSIS — R0602 Shortness of breath: Secondary | ICD-10-CM | POA: Diagnosis not present

## 2017-12-20 DIAGNOSIS — R1084 Generalized abdominal pain: Secondary | ICD-10-CM | POA: Diagnosis not present

## 2017-12-20 DIAGNOSIS — R109 Unspecified abdominal pain: Secondary | ICD-10-CM | POA: Diagnosis not present

## 2017-12-20 DIAGNOSIS — J189 Pneumonia, unspecified organism: Secondary | ICD-10-CM | POA: Diagnosis not present

## 2017-12-20 DIAGNOSIS — D510 Vitamin B12 deficiency anemia due to intrinsic factor deficiency: Secondary | ICD-10-CM | POA: Diagnosis not present

## 2017-12-20 DIAGNOSIS — E876 Hypokalemia: Secondary | ICD-10-CM | POA: Diagnosis not present

## 2017-12-22 DIAGNOSIS — R319 Hematuria, unspecified: Secondary | ICD-10-CM | POA: Diagnosis not present

## 2017-12-25 DIAGNOSIS — N401 Enlarged prostate with lower urinary tract symptoms: Secondary | ICD-10-CM | POA: Diagnosis not present

## 2017-12-25 DIAGNOSIS — N210 Calculus in bladder: Secondary | ICD-10-CM | POA: Diagnosis not present

## 2017-12-26 DIAGNOSIS — Z681 Body mass index (BMI) 19 or less, adult: Secondary | ICD-10-CM | POA: Diagnosis not present

## 2017-12-26 DIAGNOSIS — J189 Pneumonia, unspecified organism: Secondary | ICD-10-CM | POA: Diagnosis not present

## 2017-12-26 DIAGNOSIS — F329 Major depressive disorder, single episode, unspecified: Secondary | ICD-10-CM | POA: Diagnosis not present

## 2017-12-28 DIAGNOSIS — I482 Chronic atrial fibrillation: Secondary | ICD-10-CM | POA: Diagnosis not present

## 2017-12-28 DIAGNOSIS — H548 Legal blindness, as defined in USA: Secondary | ICD-10-CM | POA: Diagnosis not present

## 2017-12-28 DIAGNOSIS — M1991 Primary osteoarthritis, unspecified site: Secondary | ICD-10-CM | POA: Diagnosis not present

## 2017-12-28 DIAGNOSIS — S72001D Fracture of unspecified part of neck of right femur, subsequent encounter for closed fracture with routine healing: Secondary | ICD-10-CM | POA: Diagnosis not present

## 2017-12-28 DIAGNOSIS — D649 Anemia, unspecified: Secondary | ICD-10-CM | POA: Diagnosis not present

## 2017-12-28 DIAGNOSIS — I10 Essential (primary) hypertension: Secondary | ICD-10-CM | POA: Diagnosis not present

## 2018-01-11 DIAGNOSIS — W19XXXD Unspecified fall, subsequent encounter: Secondary | ICD-10-CM | POA: Diagnosis not present

## 2018-01-11 DIAGNOSIS — Z7401 Bed confinement status: Secondary | ICD-10-CM | POA: Diagnosis not present

## 2018-01-11 DIAGNOSIS — J90 Pleural effusion, not elsewhere classified: Secondary | ICD-10-CM | POA: Diagnosis not present

## 2018-01-11 DIAGNOSIS — E440 Moderate protein-calorie malnutrition: Secondary | ICD-10-CM | POA: Diagnosis present

## 2018-01-11 DIAGNOSIS — M1990 Unspecified osteoarthritis, unspecified site: Secondary | ICD-10-CM | POA: Diagnosis present

## 2018-01-11 DIAGNOSIS — E46 Unspecified protein-calorie malnutrition: Secondary | ICD-10-CM | POA: Diagnosis not present

## 2018-01-11 DIAGNOSIS — Z4682 Encounter for fitting and adjustment of non-vascular catheter: Secondary | ICD-10-CM | POA: Diagnosis not present

## 2018-01-11 DIAGNOSIS — S3991XA Unspecified injury of abdomen, initial encounter: Secondary | ICD-10-CM | POA: Diagnosis not present

## 2018-01-11 DIAGNOSIS — S2241XD Multiple fractures of ribs, right side, subsequent encounter for fracture with routine healing: Secondary | ICD-10-CM | POA: Diagnosis not present

## 2018-01-11 DIAGNOSIS — S2249XD Multiple fractures of ribs, unspecified side, subsequent encounter for fracture with routine healing: Secondary | ICD-10-CM | POA: Diagnosis not present

## 2018-01-11 DIAGNOSIS — I10 Essential (primary) hypertension: Secondary | ICD-10-CM | POA: Diagnosis present

## 2018-01-11 DIAGNOSIS — E876 Hypokalemia: Secondary | ICD-10-CM | POA: Diagnosis present

## 2018-01-11 DIAGNOSIS — Z9181 History of falling: Secondary | ICD-10-CM | POA: Diagnosis not present

## 2018-01-11 DIAGNOSIS — R918 Other nonspecific abnormal finding of lung field: Secondary | ICD-10-CM | POA: Diagnosis not present

## 2018-01-11 DIAGNOSIS — Z79899 Other long term (current) drug therapy: Secondary | ICD-10-CM | POA: Diagnosis not present

## 2018-01-11 DIAGNOSIS — R531 Weakness: Secondary | ICD-10-CM | POA: Diagnosis not present

## 2018-01-11 DIAGNOSIS — R2689 Other abnormalities of gait and mobility: Secondary | ICD-10-CM | POA: Diagnosis not present

## 2018-01-11 DIAGNOSIS — Z23 Encounter for immunization: Secondary | ICD-10-CM | POA: Diagnosis not present

## 2018-01-11 DIAGNOSIS — Z681 Body mass index (BMI) 19 or less, adult: Secondary | ICD-10-CM | POA: Diagnosis not present

## 2018-01-11 DIAGNOSIS — Z950 Presence of cardiac pacemaker: Secondary | ICD-10-CM | POA: Diagnosis not present

## 2018-01-11 DIAGNOSIS — R0902 Hypoxemia: Secondary | ICD-10-CM | POA: Diagnosis not present

## 2018-01-11 DIAGNOSIS — S51012A Laceration without foreign body of left elbow, initial encounter: Secondary | ICD-10-CM | POA: Diagnosis not present

## 2018-01-11 DIAGNOSIS — J939 Pneumothorax, unspecified: Secondary | ICD-10-CM | POA: Diagnosis not present

## 2018-01-11 DIAGNOSIS — R638 Other symptoms and signs concerning food and fluid intake: Secondary | ICD-10-CM | POA: Diagnosis not present

## 2018-01-11 DIAGNOSIS — M810 Age-related osteoporosis without current pathological fracture: Secondary | ICD-10-CM | POA: Diagnosis not present

## 2018-01-11 DIAGNOSIS — R1311 Dysphagia, oral phase: Secondary | ICD-10-CM | POA: Diagnosis not present

## 2018-01-11 DIAGNOSIS — W19XXXA Unspecified fall, initial encounter: Secondary | ICD-10-CM | POA: Diagnosis not present

## 2018-01-11 DIAGNOSIS — Z741 Need for assistance with personal care: Secondary | ICD-10-CM | POA: Diagnosis not present

## 2018-01-11 DIAGNOSIS — F419 Anxiety disorder, unspecified: Secondary | ICD-10-CM | POA: Diagnosis not present

## 2018-01-11 DIAGNOSIS — S20311A Abrasion of right front wall of thorax, initial encounter: Secondary | ICD-10-CM | POA: Diagnosis not present

## 2018-01-11 DIAGNOSIS — R0781 Pleurodynia: Secondary | ICD-10-CM | POA: Diagnosis not present

## 2018-01-11 DIAGNOSIS — F329 Major depressive disorder, single episode, unspecified: Secondary | ICD-10-CM | POA: Diagnosis not present

## 2018-01-11 DIAGNOSIS — R58 Hemorrhage, not elsewhere classified: Secondary | ICD-10-CM | POA: Diagnosis not present

## 2018-01-11 DIAGNOSIS — S270XXA Traumatic pneumothorax, initial encounter: Secondary | ICD-10-CM | POA: Diagnosis not present

## 2018-01-11 DIAGNOSIS — S299XXA Unspecified injury of thorax, initial encounter: Secondary | ICD-10-CM | POA: Diagnosis not present

## 2018-01-11 DIAGNOSIS — J189 Pneumonia, unspecified organism: Secondary | ICD-10-CM | POA: Diagnosis not present

## 2018-01-11 DIAGNOSIS — S270XXD Traumatic pneumothorax, subsequent encounter: Secondary | ICD-10-CM | POA: Diagnosis not present

## 2018-01-11 DIAGNOSIS — F0390 Unspecified dementia without behavioral disturbance: Secondary | ICD-10-CM | POA: Diagnosis not present

## 2018-01-11 DIAGNOSIS — S2241XA Multiple fractures of ribs, right side, initial encounter for closed fracture: Secondary | ICD-10-CM | POA: Diagnosis present

## 2018-01-16 DIAGNOSIS — E440 Moderate protein-calorie malnutrition: Secondary | ICD-10-CM | POA: Diagnosis not present

## 2018-01-16 DIAGNOSIS — Z950 Presence of cardiac pacemaker: Secondary | ICD-10-CM | POA: Diagnosis not present

## 2018-01-16 DIAGNOSIS — F0390 Unspecified dementia without behavioral disturbance: Secondary | ICD-10-CM | POA: Diagnosis not present

## 2018-01-16 DIAGNOSIS — R0902 Hypoxemia: Secondary | ICD-10-CM | POA: Diagnosis not present

## 2018-01-16 DIAGNOSIS — E441 Mild protein-calorie malnutrition: Secondary | ICD-10-CM | POA: Diagnosis not present

## 2018-01-16 DIAGNOSIS — J939 Pneumothorax, unspecified: Secondary | ICD-10-CM | POA: Diagnosis not present

## 2018-01-16 DIAGNOSIS — Z9181 History of falling: Secondary | ICD-10-CM | POA: Diagnosis not present

## 2018-01-16 DIAGNOSIS — R05 Cough: Secondary | ICD-10-CM | POA: Diagnosis not present

## 2018-01-16 DIAGNOSIS — Z681 Body mass index (BMI) 19 or less, adult: Secondary | ICD-10-CM | POA: Diagnosis not present

## 2018-01-16 DIAGNOSIS — S2241XA Multiple fractures of ribs, right side, initial encounter for closed fracture: Secondary | ICD-10-CM | POA: Diagnosis not present

## 2018-01-16 DIAGNOSIS — R2689 Other abnormalities of gait and mobility: Secondary | ICD-10-CM | POA: Diagnosis not present

## 2018-01-16 DIAGNOSIS — Z23 Encounter for immunization: Secondary | ICD-10-CM | POA: Diagnosis not present

## 2018-01-16 DIAGNOSIS — F419 Anxiety disorder, unspecified: Secondary | ICD-10-CM | POA: Diagnosis not present

## 2018-01-16 DIAGNOSIS — J189 Pneumonia, unspecified organism: Secondary | ICD-10-CM | POA: Diagnosis not present

## 2018-01-16 DIAGNOSIS — Z79899 Other long term (current) drug therapy: Secondary | ICD-10-CM | POA: Diagnosis not present

## 2018-01-16 DIAGNOSIS — F329 Major depressive disorder, single episode, unspecified: Secondary | ICD-10-CM | POA: Diagnosis not present

## 2018-01-16 DIAGNOSIS — R1311 Dysphagia, oral phase: Secondary | ICD-10-CM | POA: Diagnosis not present

## 2018-01-16 DIAGNOSIS — E46 Unspecified protein-calorie malnutrition: Secondary | ICD-10-CM | POA: Diagnosis not present

## 2018-01-16 DIAGNOSIS — R531 Weakness: Secondary | ICD-10-CM | POA: Diagnosis not present

## 2018-01-16 DIAGNOSIS — Z741 Need for assistance with personal care: Secondary | ICD-10-CM | POA: Diagnosis not present

## 2018-01-16 DIAGNOSIS — R262 Difficulty in walking, not elsewhere classified: Secondary | ICD-10-CM | POA: Diagnosis not present

## 2018-01-16 DIAGNOSIS — I10 Essential (primary) hypertension: Secondary | ICD-10-CM | POA: Diagnosis not present

## 2018-01-16 DIAGNOSIS — Z7401 Bed confinement status: Secondary | ICD-10-CM | POA: Diagnosis not present

## 2018-01-16 DIAGNOSIS — R638 Other symptoms and signs concerning food and fluid intake: Secondary | ICD-10-CM | POA: Diagnosis not present

## 2018-01-16 DIAGNOSIS — W19XXXD Unspecified fall, subsequent encounter: Secondary | ICD-10-CM | POA: Diagnosis not present

## 2018-01-16 DIAGNOSIS — S2249XD Multiple fractures of ribs, unspecified side, subsequent encounter for fracture with routine healing: Secondary | ICD-10-CM | POA: Diagnosis not present

## 2018-01-16 DIAGNOSIS — E876 Hypokalemia: Secondary | ICD-10-CM | POA: Diagnosis not present

## 2018-01-16 DIAGNOSIS — M810 Age-related osteoporosis without current pathological fracture: Secondary | ICD-10-CM | POA: Diagnosis not present

## 2018-01-16 DIAGNOSIS — M1990 Unspecified osteoarthritis, unspecified site: Secondary | ICD-10-CM | POA: Diagnosis not present

## 2018-01-19 DIAGNOSIS — S2249XD Multiple fractures of ribs, unspecified side, subsequent encounter for fracture with routine healing: Secondary | ICD-10-CM | POA: Diagnosis not present

## 2018-01-19 DIAGNOSIS — M810 Age-related osteoporosis without current pathological fracture: Secondary | ICD-10-CM | POA: Diagnosis not present

## 2018-01-19 DIAGNOSIS — E441 Mild protein-calorie malnutrition: Secondary | ICD-10-CM | POA: Diagnosis not present

## 2018-01-19 DIAGNOSIS — R262 Difficulty in walking, not elsewhere classified: Secondary | ICD-10-CM | POA: Diagnosis not present

## 2018-02-05 DIAGNOSIS — I10 Essential (primary) hypertension: Secondary | ICD-10-CM | POA: Diagnosis not present

## 2018-02-05 DIAGNOSIS — R5383 Other fatigue: Secondary | ICD-10-CM | POA: Diagnosis not present

## 2018-02-05 DIAGNOSIS — Z79899 Other long term (current) drug therapy: Secondary | ICD-10-CM | POA: Diagnosis not present

## 2018-02-05 DIAGNOSIS — Z2821 Immunization not carried out because of patient refusal: Secondary | ICD-10-CM | POA: Diagnosis not present

## 2018-02-05 DIAGNOSIS — Z681 Body mass index (BMI) 19 or less, adult: Secondary | ICD-10-CM | POA: Diagnosis not present

## 2018-02-05 DIAGNOSIS — F329 Major depressive disorder, single episode, unspecified: Secondary | ICD-10-CM | POA: Diagnosis not present

## 2018-03-02 DEATH — deceased

## 2018-03-05 ENCOUNTER — Telehealth: Payer: Self-pay

## 2018-03-05 ENCOUNTER — Encounter: Payer: Medicare Other | Admitting: *Deleted

## 2018-03-05 NOTE — Telephone Encounter (Signed)
Attempted to confirm remote transmission with pt. No answer and was unable to leave a message.   

## 2018-03-12 ENCOUNTER — Encounter: Payer: Self-pay | Admitting: Cardiology
